# Patient Record
Sex: Female | Born: 1935 | Race: White | Hispanic: No | Marital: Married
Health system: Southern US, Community
[De-identification: ages and names within clinical notes are randomized; demographics above are authoritative.]

---

## 2002-12-02 ENCOUNTER — Other Ambulatory Visit: Payer: Self-pay

## 2004-05-19 ENCOUNTER — Ambulatory Visit: Payer: Self-pay | Admitting: Family Medicine

## 2004-11-29 ENCOUNTER — Ambulatory Visit: Payer: Self-pay | Admitting: Family Medicine

## 2004-12-05 ENCOUNTER — Ambulatory Visit: Payer: Self-pay | Admitting: *Deleted

## 2005-01-02 ENCOUNTER — Ambulatory Visit: Payer: Self-pay | Admitting: Oncology

## 2005-01-16 ENCOUNTER — Ambulatory Visit: Payer: Self-pay | Admitting: Oncology

## 2005-02-16 ENCOUNTER — Ambulatory Visit: Payer: Self-pay | Admitting: Oncology

## 2005-03-16 ENCOUNTER — Ambulatory Visit: Payer: Self-pay | Admitting: Oncology

## 2005-04-18 ENCOUNTER — Ambulatory Visit: Payer: Self-pay | Admitting: Oncology

## 2005-05-16 ENCOUNTER — Ambulatory Visit: Payer: Self-pay | Admitting: Oncology

## 2005-06-16 ENCOUNTER — Ambulatory Visit: Payer: Self-pay | Admitting: Oncology

## 2005-07-16 ENCOUNTER — Ambulatory Visit: Payer: Self-pay | Admitting: Oncology

## 2005-08-23 ENCOUNTER — Ambulatory Visit: Payer: Self-pay | Admitting: Oncology

## 2005-09-16 ENCOUNTER — Ambulatory Visit: Payer: Self-pay | Admitting: Oncology

## 2005-09-21 ENCOUNTER — Ambulatory Visit: Payer: Self-pay | Admitting: Family Medicine

## 2005-10-16 ENCOUNTER — Ambulatory Visit: Payer: Self-pay | Admitting: Oncology

## 2005-11-16 ENCOUNTER — Ambulatory Visit: Payer: Self-pay | Admitting: Oncology

## 2005-12-21 ENCOUNTER — Ambulatory Visit: Payer: Self-pay | Admitting: Oncology

## 2006-01-16 ENCOUNTER — Ambulatory Visit: Payer: Self-pay | Admitting: Oncology

## 2006-02-16 ENCOUNTER — Ambulatory Visit: Payer: Self-pay | Admitting: Oncology

## 2006-03-17 ENCOUNTER — Ambulatory Visit: Payer: Self-pay | Admitting: Oncology

## 2006-04-17 ENCOUNTER — Ambulatory Visit: Payer: Self-pay | Admitting: Oncology

## 2006-05-17 ENCOUNTER — Ambulatory Visit: Payer: Self-pay | Admitting: Oncology

## 2006-06-17 ENCOUNTER — Ambulatory Visit: Payer: Self-pay | Admitting: Oncology

## 2006-07-17 ENCOUNTER — Ambulatory Visit: Payer: Self-pay | Admitting: Oncology

## 2006-08-17 ENCOUNTER — Ambulatory Visit: Payer: Self-pay | Admitting: Oncology

## 2006-09-17 ENCOUNTER — Ambulatory Visit: Payer: Self-pay | Admitting: Oncology

## 2006-10-17 ENCOUNTER — Ambulatory Visit: Payer: Self-pay | Admitting: Oncology

## 2006-10-25 ENCOUNTER — Ambulatory Visit: Payer: Self-pay | Admitting: Oncology

## 2006-11-17 ENCOUNTER — Ambulatory Visit: Payer: Self-pay | Admitting: Oncology

## 2006-12-17 ENCOUNTER — Ambulatory Visit: Payer: Self-pay | Admitting: Oncology

## 2007-01-17 ENCOUNTER — Ambulatory Visit: Payer: Self-pay | Admitting: Oncology

## 2007-02-07 ENCOUNTER — Ambulatory Visit: Payer: Self-pay | Admitting: Oncology

## 2007-02-13 ENCOUNTER — Ambulatory Visit: Payer: Self-pay | Admitting: Oncology

## 2007-02-17 ENCOUNTER — Ambulatory Visit: Payer: Self-pay | Admitting: Oncology

## 2007-04-17 ENCOUNTER — Ambulatory Visit: Payer: Self-pay | Admitting: Oncology

## 2007-04-18 ENCOUNTER — Ambulatory Visit: Payer: Self-pay | Admitting: Oncology

## 2007-05-17 ENCOUNTER — Ambulatory Visit: Payer: Self-pay | Admitting: Oncology

## 2007-06-17 ENCOUNTER — Ambulatory Visit: Payer: Self-pay | Admitting: Oncology

## 2007-07-17 ENCOUNTER — Ambulatory Visit: Payer: Self-pay | Admitting: Oncology

## 2007-07-31 ENCOUNTER — Ambulatory Visit: Payer: Self-pay | Admitting: Ophthalmology

## 2007-08-17 ENCOUNTER — Ambulatory Visit: Payer: Self-pay | Admitting: Oncology

## 2007-08-22 ENCOUNTER — Ambulatory Visit: Payer: Self-pay | Admitting: Oncology

## 2007-09-17 ENCOUNTER — Ambulatory Visit: Payer: Self-pay | Admitting: Oncology

## 2007-10-03 ENCOUNTER — Ambulatory Visit: Payer: Self-pay | Admitting: Family Medicine

## 2008-02-17 ENCOUNTER — Ambulatory Visit: Payer: Self-pay | Admitting: Oncology

## 2008-02-20 ENCOUNTER — Ambulatory Visit: Payer: Self-pay | Admitting: Oncology

## 2008-03-16 ENCOUNTER — Ambulatory Visit: Payer: Self-pay | Admitting: Oncology

## 2008-06-16 ENCOUNTER — Ambulatory Visit: Payer: Self-pay | Admitting: Oncology

## 2008-06-24 ENCOUNTER — Ambulatory Visit: Payer: Self-pay | Admitting: Oncology

## 2008-07-16 ENCOUNTER — Ambulatory Visit: Payer: Self-pay | Admitting: Oncology

## 2008-08-16 ENCOUNTER — Ambulatory Visit: Payer: Self-pay | Admitting: Oncology

## 2008-08-20 IMAGING — CT CT ABD-PELV W/ CM
1 of 2 series · 15 of 32 positions shown, 19 images · non-contrast
Comparison: none

REASON FOR EXAM: Abd Pain Elevated CEA Diabetic Not Metformin
COMMENTS:

PROCEDURE:     CT  - CT ABDOMEN / PELVIS  W  - September 21, 2005  [DATE]
RESULT:
REASON FOR CONSULTATION:    Pelvic pain with elevated CEA.
TECHNIQUE: Axial images were obtained from the hemidiaphragm to the pubic
symphysis post intravenous and oral administration of contrast material.

[Series 2: soft tissue · axial · 0.85mm/px · z∈[-660,-252]mm · 15 of 57 slices shown, 19 images]
[im 3/57  soft-tissue]
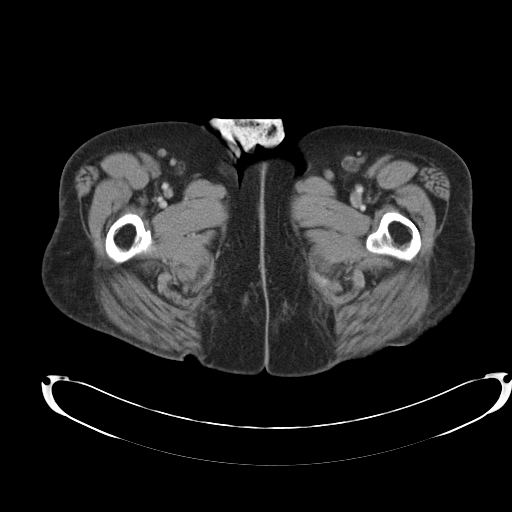
[im 3/57  bone]
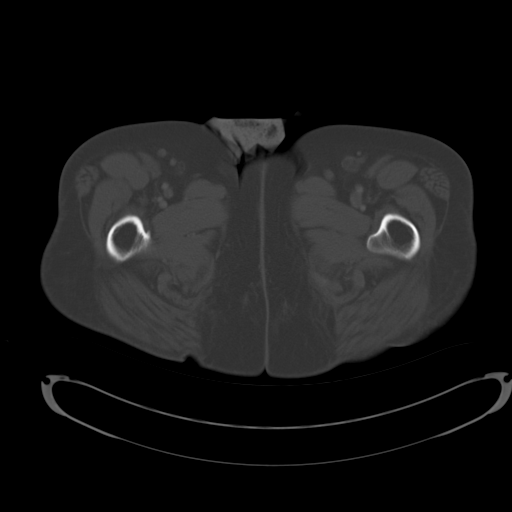
[im 8/57  soft-tissue]
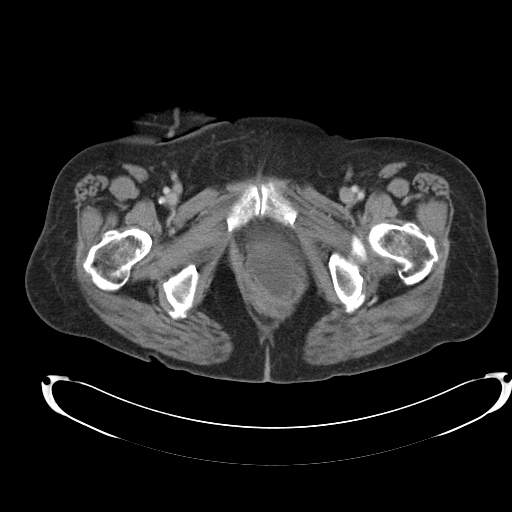
[im 13/57  soft-tissue]
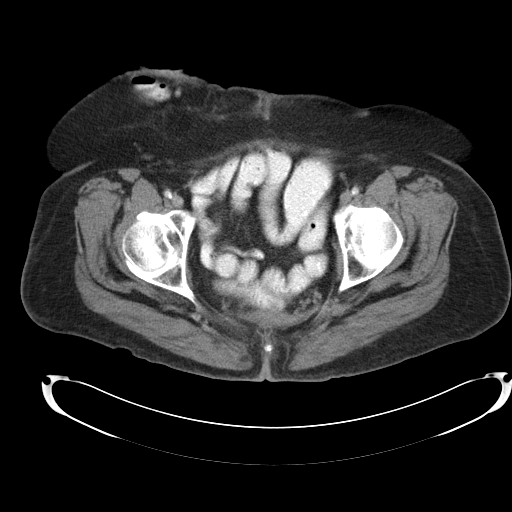
[im 15/57  soft-tissue]
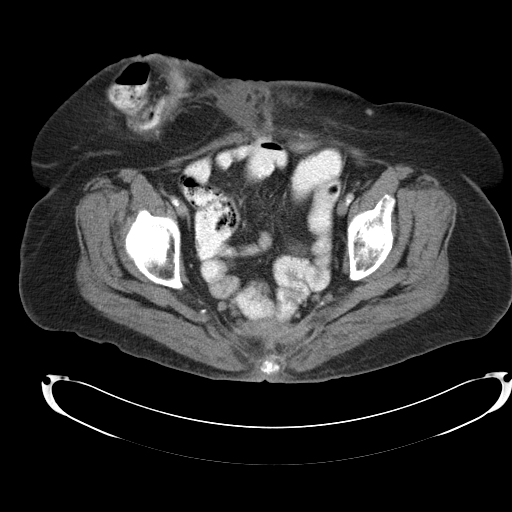
[im 20/57  soft-tissue]
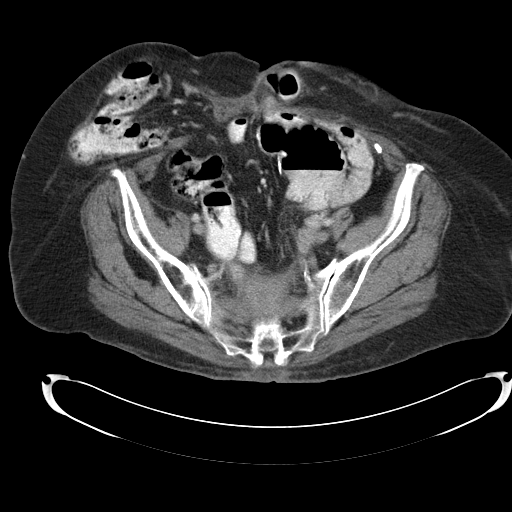
[im 25/57  soft-tissue]
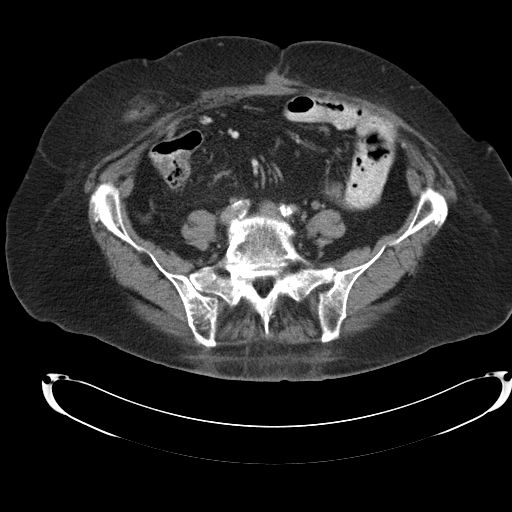
[im 30/57  soft-tissue]
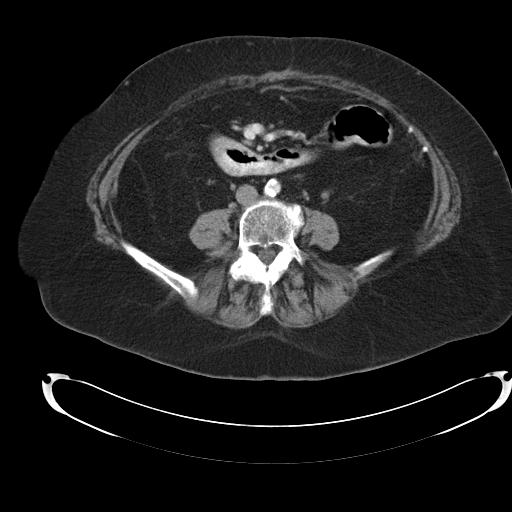
[im 32/57  soft-tissue]
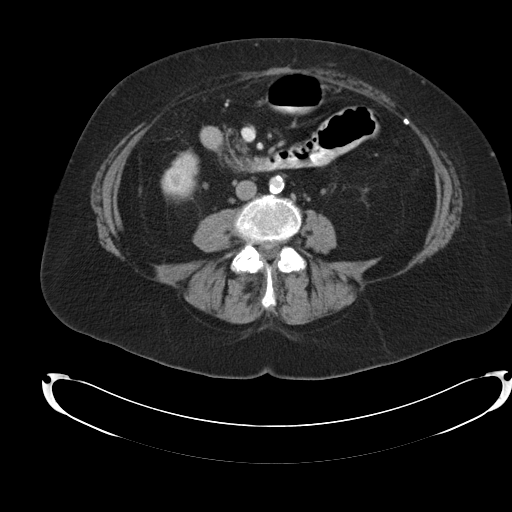
[im 37/57  soft-tissue]
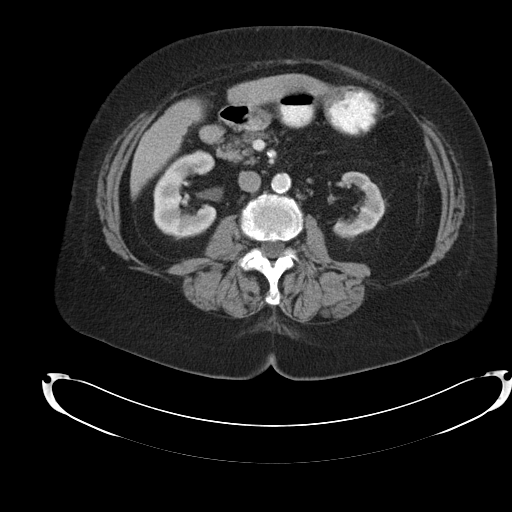
[im 37/57  bone]
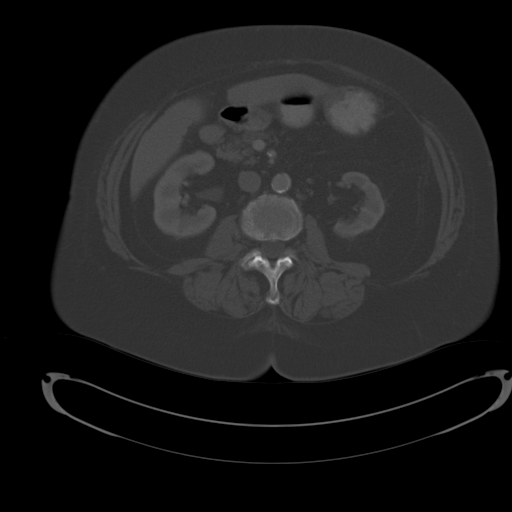
[im 42/57  soft-tissue]
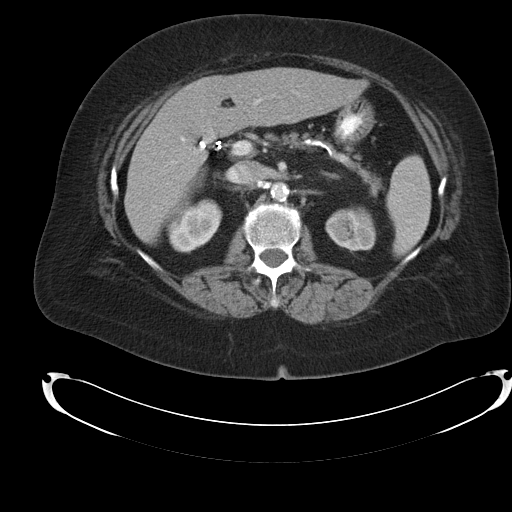
[im 44/57  soft-tissue]
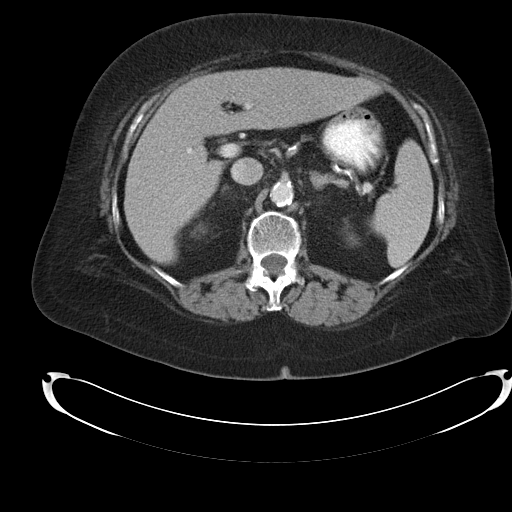
[im 47/57  lung]
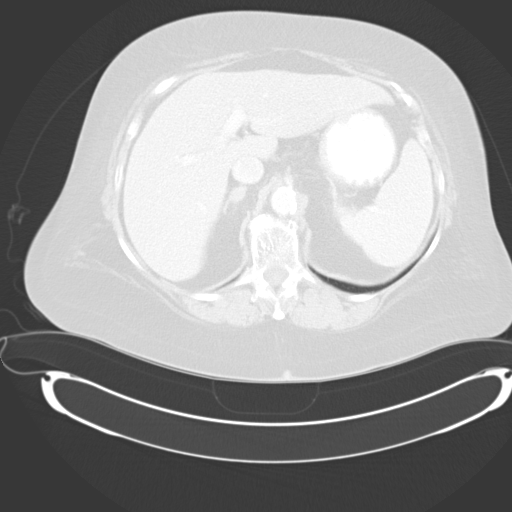
[im 49/57  soft-tissue]
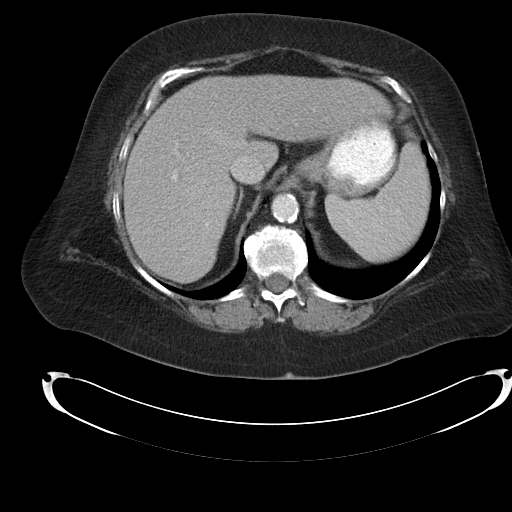
[im 49/57  lung]
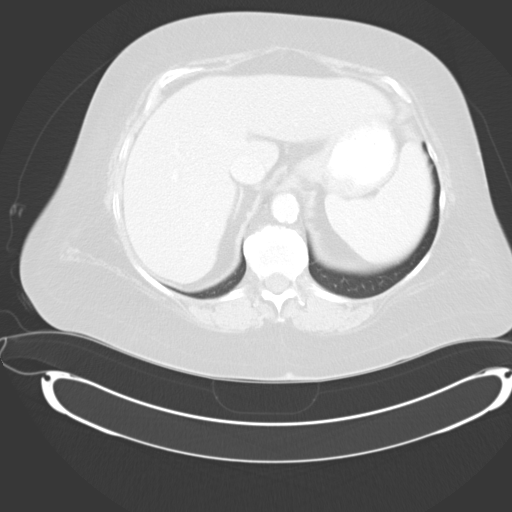
[im 52/57  lung]
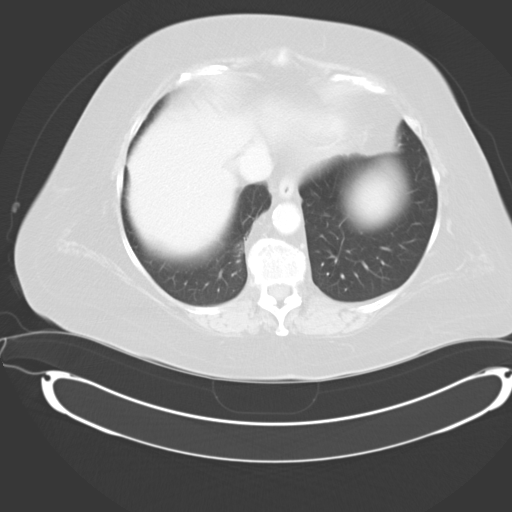
[im 54/57  soft-tissue]
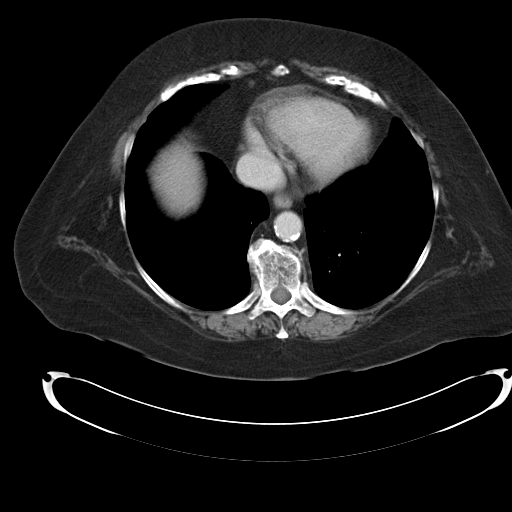
[im 54/57  lung]
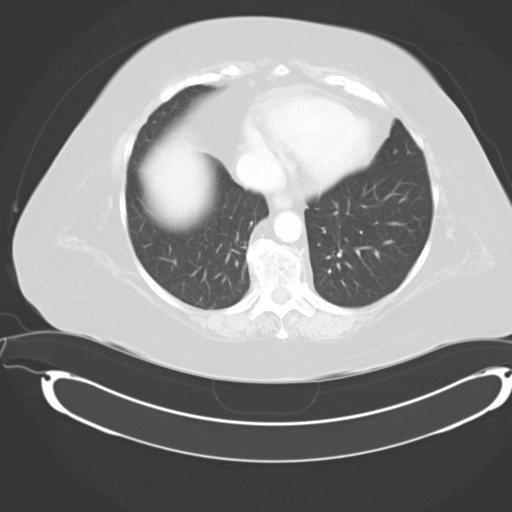

[15 of 32 positions shown; findings below may reference images not displayed]

FINDINGS: The liver and spleen appear intact.  No focal masses are seen.
 No masses are noted of the pancreas.  Both kidneys excrete the contrast
material.  No masses identified in either kidney.  No ascites is present.
There is noted anterior and anterolateral abdominal wall hernia in the
midpelvis.  The RIGHT lateral hernia contains colon which is not distended.
In the midline, periumbilical region is a loop of small bowel which does not
appear distended.  No pelvic masses, adenopathy or retroperitoneal
adenopathy is seen.

Images through the lung bases reveal the lung bases to be clear with no
effusions.
IMPRESSION: 1.     Anterior wall hernia in the RIGHT midpelvis containing colon and fat.
 No distention of colon is seen.
2.     Periumbilical hernia at the same level containing small bowel.  No
distention of bowel loops are seen.  No additional abnormalities are noted.

## 2008-09-16 ENCOUNTER — Ambulatory Visit: Payer: Self-pay | Admitting: Oncology

## 2008-10-05 ENCOUNTER — Ambulatory Visit: Payer: Self-pay | Admitting: Oncology

## 2008-10-14 ENCOUNTER — Ambulatory Visit: Payer: Self-pay | Admitting: Oncology

## 2008-10-16 ENCOUNTER — Ambulatory Visit: Payer: Self-pay | Admitting: Oncology

## 2008-11-16 ENCOUNTER — Ambulatory Visit: Payer: Self-pay | Admitting: Oncology

## 2008-12-16 ENCOUNTER — Ambulatory Visit: Payer: Self-pay | Admitting: Oncology

## 2009-01-16 ENCOUNTER — Ambulatory Visit: Payer: Self-pay | Admitting: Oncology

## 2009-02-16 ENCOUNTER — Ambulatory Visit: Payer: Self-pay | Admitting: Oncology

## 2009-02-22 ENCOUNTER — Ambulatory Visit: Payer: Self-pay | Admitting: Oncology

## 2009-03-09 ENCOUNTER — Ambulatory Visit: Payer: Self-pay | Admitting: Oncology

## 2009-03-15 ENCOUNTER — Ambulatory Visit: Payer: Self-pay | Admitting: Vascular Surgery

## 2009-03-16 ENCOUNTER — Ambulatory Visit: Payer: Self-pay | Admitting: Oncology

## 2009-04-16 ENCOUNTER — Ambulatory Visit: Payer: Self-pay | Admitting: Oncology

## 2009-05-13 ENCOUNTER — Ambulatory Visit: Payer: Self-pay | Admitting: Oncology

## 2009-05-16 ENCOUNTER — Ambulatory Visit: Payer: Self-pay | Admitting: Oncology

## 2009-06-16 ENCOUNTER — Ambulatory Visit: Payer: Self-pay | Admitting: Oncology

## 2009-06-28 ENCOUNTER — Ambulatory Visit: Payer: Self-pay | Admitting: General Surgery

## 2009-07-16 ENCOUNTER — Ambulatory Visit: Payer: Self-pay | Admitting: Oncology

## 2009-08-16 ENCOUNTER — Ambulatory Visit: Payer: Self-pay | Admitting: Oncology

## 2009-09-15 ENCOUNTER — Ambulatory Visit: Payer: Self-pay | Admitting: Oncology

## 2009-09-16 ENCOUNTER — Ambulatory Visit: Payer: Self-pay | Admitting: Oncology

## 2009-10-16 ENCOUNTER — Ambulatory Visit: Payer: Self-pay | Admitting: Oncology

## 2009-11-16 ENCOUNTER — Ambulatory Visit: Payer: Self-pay | Admitting: Oncology

## 2009-11-18 ENCOUNTER — Ambulatory Visit: Payer: Self-pay | Admitting: Oncology

## 2009-12-16 ENCOUNTER — Ambulatory Visit: Payer: Self-pay | Admitting: Oncology

## 2010-01-16 ENCOUNTER — Ambulatory Visit: Payer: Self-pay | Admitting: Oncology

## 2010-02-16 ENCOUNTER — Ambulatory Visit: Payer: Self-pay | Admitting: Oncology

## 2010-03-17 ENCOUNTER — Ambulatory Visit: Payer: Self-pay | Admitting: Oncology

## 2010-03-18 ENCOUNTER — Emergency Department: Payer: Self-pay | Admitting: Emergency Medicine

## 2010-04-17 ENCOUNTER — Ambulatory Visit: Payer: Self-pay | Admitting: Oncology

## 2010-05-17 ENCOUNTER — Ambulatory Visit: Payer: Self-pay | Admitting: Oncology

## 2010-06-17 ENCOUNTER — Ambulatory Visit: Payer: Self-pay | Admitting: Oncology

## 2010-06-28 ENCOUNTER — Ambulatory Visit: Payer: Self-pay | Admitting: Family Medicine

## 2010-07-17 ENCOUNTER — Ambulatory Visit: Payer: Self-pay | Admitting: Oncology

## 2010-08-03 ENCOUNTER — Ambulatory Visit: Payer: Self-pay | Admitting: Family Medicine

## 2010-08-04 ENCOUNTER — Ambulatory Visit: Payer: Self-pay | Admitting: Oncology

## 2010-08-04 ENCOUNTER — Ambulatory Visit: Payer: Self-pay

## 2010-08-17 ENCOUNTER — Ambulatory Visit: Payer: Self-pay | Admitting: Oncology

## 2010-08-18 ENCOUNTER — Inpatient Hospital Stay: Payer: Self-pay | Admitting: Orthopedic Surgery

## 2010-09-06 LAB — CEA: CEA: 9.4 ng/mL — ABNORMAL HIGH (ref 0.0–4.7)

## 2010-09-17 ENCOUNTER — Ambulatory Visit: Payer: Self-pay | Admitting: Oncology

## 2010-09-23 ENCOUNTER — Ambulatory Visit: Payer: Self-pay | Admitting: Oncology

## 2010-10-17 ENCOUNTER — Ambulatory Visit: Payer: Self-pay | Admitting: Oncology

## 2010-11-17 ENCOUNTER — Ambulatory Visit: Payer: Self-pay | Admitting: Oncology

## 2010-11-26 LAB — CEA: CEA: 6.8 ng/mL — ABNORMAL HIGH (ref 0.0–4.7)

## 2010-12-17 ENCOUNTER — Ambulatory Visit: Payer: Self-pay | Admitting: Oncology

## 2011-01-27 ENCOUNTER — Ambulatory Visit: Payer: Self-pay | Admitting: Oncology

## 2011-02-17 ENCOUNTER — Ambulatory Visit: Payer: Self-pay | Admitting: Oncology

## 2011-03-07 LAB — COMPREHENSIVE METABOLIC PANEL
Albumin: 3.9 g/dL (ref 3.4–5.0)
Anion Gap: 10 (ref 7–16)
Bilirubin,Total: 0.5 mg/dL (ref 0.2–1.0)
Calcium, Total: 9.2 mg/dL (ref 8.5–10.1)
Co2: 25 mmol/L (ref 21–32)
Creatinine: 1.12 mg/dL (ref 0.60–1.30)
EGFR (African American): >60
EGFR (Non-African Amer.): 51 — ABNORMAL LOW
Osmolality: 281 (ref 275–301)
SGOT(AST): 32 U/L (ref 15–37)
Total Protein: 7.5 g/dL (ref 6.4–8.2)

## 2011-03-07 LAB — CBC CANCER CENTER
Basophil %: 0.4 %
Eosinophil #: 0.1 x10 3/mm (ref 0.0–0.7)
Eosinophil %: 2 %
HCT: 39.3 % (ref 35.0–47.0)
HGB: 13.4 g/dL (ref 12.0–16.0)
Lymphocyte %: 15 %
MCHC: 34.2 g/dL (ref 32.0–36.0)
Monocyte %: 6.6 %
Neutrophil #: 4.7 x10 3/mm (ref 1.4–6.5)
Neutrophil %: 76 %
WBC: 6.2 x10 3/mm (ref 3.6–11.0)

## 2011-03-08 LAB — CEA: CEA: 6.2 ng/mL — ABNORMAL HIGH (ref 0.0–4.7)

## 2011-03-17 ENCOUNTER — Ambulatory Visit: Payer: Self-pay | Admitting: Oncology

## 2011-04-25 ENCOUNTER — Ambulatory Visit: Payer: Self-pay | Admitting: Oncology

## 2011-05-17 ENCOUNTER — Ambulatory Visit: Payer: Self-pay | Admitting: Oncology

## 2011-06-17 ENCOUNTER — Ambulatory Visit: Payer: Self-pay | Admitting: Oncology

## 2011-07-06 LAB — COMPREHENSIVE METABOLIC PANEL
Albumin: 3.5 g/dL (ref 3.4–5.0)
Alkaline Phosphatase: 108 U/L (ref 50–136)
Anion Gap: 12 (ref 7–16)
BUN: 19 mg/dL — ABNORMAL HIGH (ref 7–18)
Bilirubin,Total: 0.4 mg/dL (ref 0.2–1.0)
Chloride: 105 mmol/L (ref 98–107)
Co2: 23 mmol/L (ref 21–32)
EGFR (Non-African Amer.): 49 — ABNORMAL LOW
Glucose: 171 mg/dL — ABNORMAL HIGH (ref 65–99)
Osmolality: 286 (ref 275–301)
Potassium: 4.7 mmol/L (ref 3.5–5.1)
SGOT(AST): 26 U/L (ref 15–37)
SGPT (ALT): 28 U/L
Sodium: 140 mmol/L (ref 136–145)

## 2011-07-06 LAB — CBC CANCER CENTER
Basophil %: 1.1 %
Eosinophil #: 0.2 x10 3/mm (ref 0.0–0.7)
Eosinophil %: 4.8 %
MCH: 27.4 pg (ref 26.0–34.0)
Monocyte %: 8.8 %
Platelet: 134 x10 3/mm — ABNORMAL LOW (ref 150–440)

## 2011-07-17 ENCOUNTER — Ambulatory Visit: Payer: Self-pay | Admitting: Oncology

## 2011-08-22 ENCOUNTER — Ambulatory Visit: Payer: Self-pay | Admitting: Oncology

## 2011-09-17 ENCOUNTER — Ambulatory Visit: Payer: Self-pay | Admitting: Oncology

## 2011-10-04 LAB — CBC CANCER CENTER
Basophil #: 0.1 x10 3/mm (ref 0.0–0.1)
Basophil %: 1.4 %
Eosinophil #: 0.2 x10 3/mm (ref 0.0–0.7)
Eosinophil %: 3.4 %
HCT: 30.7 % — ABNORMAL LOW (ref 35.0–47.0)
HGB: 9.1 g/dL — ABNORMAL LOW (ref 12.0–16.0)
Lymphocyte #: 0.9 x10 3/mm — ABNORMAL LOW (ref 1.0–3.6)
Lymphocyte %: 16.6 %
MCH: 20 pg — ABNORMAL LOW (ref 26.0–34.0)
MCHC: 29.6 g/dL — ABNORMAL LOW (ref 32.0–36.0)
MCV: 68 fL — ABNORMAL LOW (ref 80–100)
Monocyte #: 0.5 x10 3/mm (ref 0.2–0.9)
Monocyte %: 9.6 %
Neutrophil #: 3.9 x10 3/mm (ref 1.4–6.5)
Neutrophil %: 69 %
Platelet: 140 x10 3/mm — ABNORMAL LOW (ref 150–440)
RBC: 4.53 10*6/uL (ref 3.80–5.20)
RDW: 19.8 % — ABNORMAL HIGH (ref 11.5–14.5)
WBC: 5.6 x10 3/mm (ref 3.6–11.0)

## 2011-10-04 LAB — IRON AND TIBC
Iron Bind.Cap.(Total): 550 ug/dL — ABNORMAL HIGH (ref 250–450)
Iron Saturation: 5 %
Iron: 30 ug/dL — ABNORMAL LOW (ref 50–170)
Unbound Iron-Bind.Cap.: 520 ug/dL

## 2011-10-04 LAB — FERRITIN: Ferritin (ARMC): 6 ng/mL — ABNORMAL LOW (ref 8–388)

## 2011-10-17 ENCOUNTER — Ambulatory Visit: Payer: Self-pay | Admitting: Oncology

## 2011-11-15 LAB — COMPREHENSIVE METABOLIC PANEL
Albumin: 3.5 g/dL (ref 3.4–5.0)
Bilirubin,Total: 0.4 mg/dL (ref 0.2–1.0)
Calcium, Total: 8.8 mg/dL (ref 8.5–10.1)
Chloride: 104 mmol/L (ref 98–107)
Co2: 19 mmol/L — ABNORMAL LOW (ref 21–32)
Creatinine: 1.34 mg/dL — ABNORMAL HIGH (ref 0.60–1.30)
EGFR (African American): 45 — ABNORMAL LOW
EGFR (Non-African Amer.): 39 — ABNORMAL LOW
Potassium: 4.1 mmol/L (ref 3.5–5.1)
SGOT(AST): 22 U/L (ref 15–37)
SGPT (ALT): 26 U/L (ref 12–78)

## 2011-11-15 LAB — CBC CANCER CENTER
Basophil #: 0.1 x10 3/mm (ref 0.0–0.1)
Basophil %: 1.2 %
Eosinophil #: 0.1 x10 3/mm (ref 0.0–0.7)
HCT: 29 % — ABNORMAL LOW (ref 35.0–47.0)
Lymphocyte #: 1.5 x10 3/mm (ref 1.0–3.6)
MCHC: 30.1 g/dL — ABNORMAL LOW (ref 32.0–36.0)
MCV: 69 fL — ABNORMAL LOW (ref 80–100)
Monocyte %: 9.9 %
Neutrophil #: 3.9 x10 3/mm (ref 1.4–6.5)
Platelet: 127 x10 3/mm — ABNORMAL LOW (ref 150–440)
RDW: 24.9 % — ABNORMAL HIGH (ref 11.5–14.5)

## 2011-11-15 LAB — IRON AND TIBC: Iron Bind.Cap.(Total): 530 ug/dL — ABNORMAL HIGH (ref 250–450)

## 2011-11-17 ENCOUNTER — Ambulatory Visit: Payer: Self-pay | Admitting: Oncology

## 2011-11-23 ENCOUNTER — Inpatient Hospital Stay: Payer: Self-pay | Admitting: Surgery

## 2011-11-23 LAB — COMPREHENSIVE METABOLIC PANEL
Anion Gap: 14 (ref 7–16)
Bilirubin,Total: 0.5 mg/dL (ref 0.2–1.0)
Calcium, Total: 9.1 mg/dL (ref 8.5–10.1)
Chloride: 99 mmol/L (ref 98–107)
Co2: 22 mmol/L (ref 21–32)
Creatinine: 1.14 mg/dL (ref 0.60–1.30)
EGFR (African American): 55 — ABNORMAL LOW
EGFR (Non-African Amer.): 48 — ABNORMAL LOW
Glucose: 132 mg/dL — ABNORMAL HIGH (ref 65–99)
Osmolality: 271 (ref 275–301)
Sodium: 135 mmol/L — ABNORMAL LOW (ref 136–145)
Total Protein: 7.2 g/dL (ref 6.4–8.2)

## 2011-11-23 LAB — CBC CANCER CENTER
Basophil #: 0.1 x10 3/mm (ref 0.0–0.1)
Eosinophil #: 0.1 x10 3/mm (ref 0.0–0.7)
Eosinophil %: 1.5 %
HCT: 33 % — ABNORMAL LOW (ref 35.0–47.0)
HGB: 9.9 g/dL — ABNORMAL LOW (ref 12.0–16.0)
Lymphocyte %: 10.8 %
MCV: 71 fL — ABNORMAL LOW (ref 80–100)
Neutrophil #: 6.2 x10 3/mm (ref 1.4–6.5)
Neutrophil %: 79.7 %
RBC: 4.64 10*6/uL (ref 3.80–5.20)

## 2011-11-23 LAB — URINALYSIS, COMPLETE
Bacteria: NONE SEEN
Bilirubin,UR: NEGATIVE
Glucose,UR: NEGATIVE mg/dL (ref 0–75)
Ketone: NEGATIVE
Protein: 100
RBC,UR: 3 /HPF (ref 0–5)
Specific Gravity: 1.011 (ref 1.003–1.030)
Squamous Epithelial: 1
WBC UR: 28 /HPF (ref 0–5)

## 2011-11-23 LAB — AMYLASE: Amylase: 96 U/L (ref 25–115)

## 2011-11-23 LAB — LIPASE, BLOOD: Lipase: 232 U/L (ref 73–393)

## 2011-11-24 LAB — BASIC METABOLIC PANEL
Anion Gap: 12 (ref 7–16)
BUN: 11 mg/dL (ref 7–18)
Calcium, Total: 8.6 mg/dL (ref 8.5–10.1)
Chloride: 103 mmol/L (ref 98–107)
Co2: 23 mmol/L (ref 21–32)
Creatinine: 1.05 mg/dL (ref 0.60–1.30)
EGFR (African American): >60
Osmolality: 277 (ref 275–301)
Sodium: 138 mmol/L (ref 136–145)

## 2011-11-24 LAB — CBC WITH DIFFERENTIAL/PLATELET
Basophil #: 0.1 10*3/uL (ref 0.0–0.1)
Basophil %: 1 %
Eosinophil #: 0.1 10*3/uL (ref 0.0–0.7)
Eosinophil %: 0.8 %
HCT: 29.5 % — ABNORMAL LOW (ref 35.0–47.0)
HGB: 9.3 g/dL — ABNORMAL LOW (ref 12.0–16.0)
Lymphocyte %: 12.5 %
MCHC: 31.4 g/dL — ABNORMAL LOW (ref 32.0–36.0)
Monocyte #: 0.7 x10 3/mm (ref 0.2–0.9)
Monocyte %: 9.6 %
Neutrophil #: 5.2 10*3/uL (ref 1.4–6.5)
Neutrophil %: 76.1 %
RBC: 4.23 10*6/uL (ref 3.80–5.20)
WBC: 6.8 10*3/uL (ref 3.6–11.0)

## 2011-11-24 LAB — IRON AND TIBC
Iron Bind.Cap.(Total): 460 ug/dL — ABNORMAL HIGH (ref 250–450)
Iron Saturation: 5 %
Unbound Iron-Bind.Cap.: 437 ug/dL

## 2011-11-25 LAB — CBC WITH DIFFERENTIAL/PLATELET
Basophil #: 0 10*3/uL (ref 0.0–0.1)
Eosinophil %: 0 %
HCT: 29.8 % — ABNORMAL LOW (ref 35.0–47.0)
Lymphocyte #: 0.2 10*3/uL — ABNORMAL LOW (ref 1.0–3.6)
Lymphocyte %: 1.6 %
MCV: 71 fL — ABNORMAL LOW (ref 80–100)
Monocyte #: 0.7 x10 3/mm (ref 0.2–0.9)
Monocyte %: 5.3 %
Neutrophil %: 92.9 %
Platelet: 106 10*3/uL — ABNORMAL LOW (ref 150–440)
RBC: 4.23 10*6/uL (ref 3.80–5.20)
RDW: 27 % — ABNORMAL HIGH (ref 11.5–14.5)
WBC: 13.6 10*3/uL — ABNORMAL HIGH (ref 3.6–11.0)

## 2011-11-25 LAB — BASIC METABOLIC PANEL
BUN: 14 mg/dL (ref 7–18)
Calcium, Total: 7.8 mg/dL — ABNORMAL LOW (ref 8.5–10.1)
Chloride: 106 mmol/L (ref 98–107)
Co2: 21 mmol/L (ref 21–32)
Creatinine: 1.34 mg/dL — ABNORMAL HIGH (ref 0.60–1.30)
Glucose: 142 mg/dL — ABNORMAL HIGH (ref 65–99)
Potassium: 5 mmol/L (ref 3.5–5.1)

## 2011-11-26 LAB — CBC WITH DIFFERENTIAL/PLATELET
Basophil #: 0 10*3/uL (ref 0.0–0.1)
Basophil %: 0.2 %
Eosinophil #: 0 10*3/uL (ref 0.0–0.7)
Eosinophil %: 0 %
HGB: 8 g/dL — ABNORMAL LOW (ref 12.0–16.0)
Lymphocyte #: 0.3 10*3/uL — ABNORMAL LOW (ref 1.0–3.6)
Lymphocyte %: 2.8 %
MCH: 21.9 pg — ABNORMAL LOW (ref 26.0–34.0)
MCHC: 30.9 g/dL — ABNORMAL LOW (ref 32.0–36.0)
MCV: 71 fL — ABNORMAL LOW (ref 80–100)
Monocyte #: 1 x10 3/mm — ABNORMAL HIGH (ref 0.2–0.9)
Neutrophil %: 88.2 %
Platelet: 67 10*3/uL — ABNORMAL LOW (ref 150–440)

## 2011-11-26 LAB — BASIC METABOLIC PANEL
BUN: 20 mg/dL — ABNORMAL HIGH (ref 7–18)
Calcium, Total: 7.5 mg/dL — ABNORMAL LOW (ref 8.5–10.1)
Chloride: 111 mmol/L — ABNORMAL HIGH (ref 98–107)
Co2: 18 mmol/L — ABNORMAL LOW (ref 21–32)
Creatinine: 1.23 mg/dL (ref 0.60–1.30)
EGFR (African American): 50 — ABNORMAL LOW
Potassium: 5.3 mmol/L — ABNORMAL HIGH (ref 3.5–5.1)
Sodium: 139 mmol/L (ref 136–145)

## 2011-11-26 LAB — IRON AND TIBC
Iron Bind.Cap.(Total): 321 ug/dL (ref 250–450)
Iron Saturation: 4 %
Iron: 12 ug/dL — ABNORMAL LOW (ref 50–170)

## 2011-11-26 LAB — FERRITIN: Ferritin (ARMC): 53 ng/mL (ref 8–388)

## 2011-11-27 LAB — BASIC METABOLIC PANEL
Anion Gap: 8 (ref 7–16)
BUN: 22 mg/dL — ABNORMAL HIGH (ref 7–18)
Chloride: 113 mmol/L — ABNORMAL HIGH (ref 98–107)
Co2: 19 mmol/L — ABNORMAL LOW (ref 21–32)
Glucose: 140 mg/dL — ABNORMAL HIGH (ref 65–99)
Osmolality: 285 (ref 275–301)
Potassium: 5.1 mmol/L (ref 3.5–5.1)
Sodium: 140 mmol/L (ref 136–145)

## 2011-11-27 LAB — CBC WITH DIFFERENTIAL/PLATELET
Basophil #: 0 10*3/uL (ref 0.0–0.1)
Basophil %: 0.2 %
Eosinophil #: 0 10*3/uL (ref 0.0–0.7)
Eosinophil %: 0 %
HCT: 24.6 % — ABNORMAL LOW (ref 35.0–47.0)
HGB: 7.9 g/dL — ABNORMAL LOW (ref 12.0–16.0)
Lymphocyte %: 3.1 %
MCH: 22.8 pg — ABNORMAL LOW (ref 26.0–34.0)
Monocyte #: 0.8 x10 3/mm (ref 0.2–0.9)
Monocyte %: 7.9 %
Neutrophil #: 9.1 10*3/uL — ABNORMAL HIGH (ref 1.4–6.5)
Neutrophil %: 88.8 %
WBC: 10.2 10*3/uL (ref 3.6–11.0)

## 2011-11-28 LAB — COMPREHENSIVE METABOLIC PANEL
Alkaline Phosphatase: 62 U/L (ref 50–136)
BUN: 24 mg/dL — ABNORMAL HIGH (ref 7–18)
Bilirubin,Total: 0.7 mg/dL (ref 0.2–1.0)
Chloride: 112 mmol/L — ABNORMAL HIGH (ref 98–107)
Creatinine: 1.07 mg/dL (ref 0.60–1.30)
EGFR (African American): 60 — ABNORMAL LOW
EGFR (Non-African Amer.): 51 — ABNORMAL LOW
SGOT(AST): 42 U/L — ABNORMAL HIGH (ref 15–37)
SGPT (ALT): 18 U/L (ref 12–78)
Total Protein: 5.7 g/dL — ABNORMAL LOW (ref 6.4–8.2)

## 2011-11-28 LAB — CBC WITH DIFFERENTIAL/PLATELET
Basophil %: 0.1 %
Eosinophil #: 0 10*3/uL (ref 0.0–0.7)
Eosinophil %: 0 %
HCT: 24.6 % — ABNORMAL LOW (ref 35.0–47.0)
HGB: 7.7 g/dL — ABNORMAL LOW (ref 12.0–16.0)
Lymphocyte %: 4.9 %
MCH: 22.2 pg — ABNORMAL LOW (ref 26.0–34.0)
MCV: 72 fL — ABNORMAL LOW (ref 80–100)
Monocyte %: 9.3 %
Neutrophil #: 8.3 10*3/uL — ABNORMAL HIGH (ref 1.4–6.5)
Neutrophil %: 85.7 %
Platelet: 72 10*3/uL — ABNORMAL LOW (ref 150–440)
RBC: 3.44 10*6/uL — ABNORMAL LOW (ref 3.80–5.20)
WBC: 9.7 10*3/uL (ref 3.6–11.0)

## 2011-11-29 LAB — BASIC METABOLIC PANEL
Calcium, Total: 8 mg/dL — ABNORMAL LOW (ref 8.5–10.1)
Creatinine: 1.15 mg/dL (ref 0.60–1.30)
EGFR (African American): 55 — ABNORMAL LOW
EGFR (Non-African Amer.): 47 — ABNORMAL LOW
Glucose: 140 mg/dL — ABNORMAL HIGH (ref 65–99)
Osmolality: 293 (ref 275–301)
Potassium: 4.4 mmol/L (ref 3.5–5.1)
Sodium: 143 mmol/L (ref 136–145)

## 2011-11-29 LAB — CBC WITH DIFFERENTIAL/PLATELET
MCH: 22.4 pg — ABNORMAL LOW (ref 26.0–34.0)
MCV: 73 fL — ABNORMAL LOW (ref 80–100)
NRBC/100 WBC: 5 /
Platelet: 72 10*3/uL — ABNORMAL LOW (ref 150–440)
RBC: 3.38 10*6/uL — ABNORMAL LOW (ref 3.80–5.20)
Segmented Neutrophils: 84 %

## 2011-11-30 LAB — BASIC METABOLIC PANEL
BUN: 25 mg/dL — ABNORMAL HIGH (ref 7–18)
Calcium, Total: 7.9 mg/dL — ABNORMAL LOW (ref 8.5–10.1)
Chloride: 115 mmol/L — ABNORMAL HIGH (ref 98–107)
Co2: 18 mmol/L — ABNORMAL LOW (ref 21–32)
Creatinine: 1.06 mg/dL (ref 0.60–1.30)
EGFR (African American): >60
EGFR (Non-African Amer.): 52 — ABNORMAL LOW
Osmolality: 291 (ref 275–301)

## 2011-11-30 LAB — CBC WITH DIFFERENTIAL/PLATELET
HCT: 25.9 % — ABNORMAL LOW (ref 35.0–47.0)
Lymphocytes: 10 %
MCH: 22.8 pg — ABNORMAL LOW (ref 26.0–34.0)
MCV: 74 fL — ABNORMAL LOW (ref 80–100)
Myelocyte: 1 %
Platelet: 55 10*3/uL — ABNORMAL LOW (ref 150–440)
RBC: 3.49 10*6/uL — ABNORMAL LOW (ref 3.80–5.20)
RDW: 27 % — ABNORMAL HIGH (ref 11.5–14.5)
WBC: 7.4 10*3/uL (ref 3.6–11.0)

## 2011-12-01 LAB — CBC WITH DIFFERENTIAL/PLATELET
Basophil #: 0 10*3/uL (ref 0.0–0.1)
Basophil %: 0.3 %
Eosinophil: 1 %
HCT: 29.2 % — ABNORMAL LOW (ref 35.0–47.0)
HGB: 8.8 g/dL — ABNORMAL LOW (ref 12.0–16.0)
Lymphocyte %: 5.7 %
Lymphocytes: 10 %
MCH: 22.9 pg — ABNORMAL LOW (ref 26.0–34.0)
Monocyte %: 7.8 %
Monocytes: 11 %
NRBC/100 WBC: 6 /
Neutrophil #: 9 10*3/uL — ABNORMAL HIGH (ref 1.4–6.5)
Neutrophil %: 86.2 %
Platelet: 53 10*3/uL — ABNORMAL LOW (ref 150–440)
RBC: 3.87 10*6/uL (ref 3.80–5.20)
RDW: 27.3 % — ABNORMAL HIGH (ref 11.5–14.5)
Segmented Neutrophils: 59 %
WBC: 10.4 10*3/uL (ref 3.6–11.0)

## 2011-12-02 LAB — CBC WITH DIFFERENTIAL/PLATELET
Eosinophil: 1 %
HCT: 27.7 % — ABNORMAL LOW (ref 35.0–47.0)
HGB: 8.1 g/dL — ABNORMAL LOW (ref 12.0–16.0)
MCH: 22.4 pg — ABNORMAL LOW (ref 26.0–34.0)
MCV: 76 fL — ABNORMAL LOW (ref 80–100)
Monocytes: 6 %
NRBC/100 WBC: 4 /
RBC: 3.64 10*6/uL — ABNORMAL LOW (ref 3.80–5.20)
Segmented Neutrophils: 82 %

## 2011-12-02 LAB — FIBRINOGEN: Fibrinogen: 452 mg/dL (ref 210–470)

## 2011-12-02 LAB — PROTIME-INR: INR: 1.2

## 2011-12-03 LAB — BASIC METABOLIC PANEL
Anion Gap: 12 (ref 7–16)
BUN: 18 mg/dL (ref 7–18)
Creatinine: 0.8 mg/dL (ref 0.60–1.30)
EGFR (Non-African Amer.): >60
Glucose: 129 mg/dL — ABNORMAL HIGH (ref 65–99)
Osmolality: 285 (ref 275–301)
Potassium: 3.8 mmol/L (ref 3.5–5.1)

## 2011-12-03 LAB — CBC WITH DIFFERENTIAL/PLATELET
Basophil #: 0 10*3/uL (ref 0.0–0.1)
Basophil %: 0.1 %
Eosinophil %: 0.3 %
HGB: 8.5 g/dL — ABNORMAL LOW (ref 12.0–16.0)
Lymphocyte #: 0.4 10*3/uL — ABNORMAL LOW (ref 1.0–3.6)
Lymphocyte %: 5.1 %
MCV: 77 fL — ABNORMAL LOW (ref 80–100)
Monocyte #: 0.6 x10 3/mm (ref 0.2–0.9)
Monocyte %: 7.7 %
Neutrophil #: 6.9 10*3/uL — ABNORMAL HIGH (ref 1.4–6.5)
Neutrophil %: 86.8 %
Platelet: 42 10*3/uL — ABNORMAL LOW (ref 150–440)
RBC: 3.52 10*6/uL — ABNORMAL LOW (ref 3.80–5.20)
WBC: 7.9 10*3/uL (ref 3.6–11.0)

## 2011-12-04 LAB — BASIC METABOLIC PANEL
Anion Gap: 8 (ref 7–16)
BUN: 12 mg/dL (ref 7–18)
Calcium, Total: 7.4 mg/dL — ABNORMAL LOW (ref 8.5–10.1)
Creatinine: 0.87 mg/dL (ref 0.60–1.30)
EGFR (African American): >60
EGFR (Non-African Amer.): >60
Glucose: 123 mg/dL — ABNORMAL HIGH (ref 65–99)
Osmolality: 279 (ref 275–301)
Potassium: 3.8 mmol/L (ref 3.5–5.1)

## 2011-12-05 LAB — CBC WITH DIFFERENTIAL/PLATELET
Basophil #: 0 10*3/uL (ref 0.0–0.1)
Eosinophil #: 0 10*3/uL (ref 0.0–0.7)
HCT: 27.2 % — ABNORMAL LOW (ref 35.0–47.0)
HGB: 8.6 g/dL — ABNORMAL LOW (ref 12.0–16.0)
Lymphocyte #: 0.4 10*3/uL — ABNORMAL LOW (ref 1.0–3.6)
MCH: 24.4 pg — ABNORMAL LOW (ref 26.0–34.0)
MCHC: 31.6 g/dL — ABNORMAL LOW (ref 32.0–36.0)
MCV: 77 fL — ABNORMAL LOW (ref 80–100)
Monocyte #: 0.7 x10 3/mm (ref 0.2–0.9)
Neutrophil %: 83.4 %
Platelet: 42 10*3/uL — ABNORMAL LOW (ref 150–440)
RBC: 3.51 10*6/uL — ABNORMAL LOW (ref 3.80–5.20)
RDW: 35.8 % — ABNORMAL HIGH (ref 11.5–14.5)

## 2011-12-05 LAB — BASIC METABOLIC PANEL
Anion Gap: 7 (ref 7–16)
BUN: 11 mg/dL (ref 7–18)
Calcium, Total: 7.6 mg/dL — ABNORMAL LOW (ref 8.5–10.1)
Chloride: 110 mmol/L — ABNORMAL HIGH (ref 98–107)
EGFR (Non-African Amer.): >60
Glucose: 120 mg/dL — ABNORMAL HIGH (ref 65–99)
Osmolality: 274 (ref 275–301)
Potassium: 4.1 mmol/L (ref 3.5–5.1)

## 2011-12-06 LAB — CBC WITH DIFFERENTIAL/PLATELET
Basophil %: 0.2 %
Eosinophil %: 1.1 %
HCT: 27.1 % — ABNORMAL LOW (ref 35.0–47.0)
HGB: 8.3 g/dL — ABNORMAL LOW (ref 12.0–16.0)
Lymphocyte #: 0.3 10*3/uL — ABNORMAL LOW (ref 1.0–3.6)
MCH: 23.9 pg — ABNORMAL LOW (ref 26.0–34.0)
MCHC: 30.7 g/dL — ABNORMAL LOW (ref 32.0–36.0)
MCV: 78 fL — ABNORMAL LOW (ref 80–100)
Monocyte #: 0.7 x10 3/mm (ref 0.2–0.9)
Monocyte %: 13.4 %
Neutrophil %: 78.4 %
Platelet: 53 10*3/uL — ABNORMAL LOW (ref 150–440)
RBC: 3.48 10*6/uL — ABNORMAL LOW (ref 3.80–5.20)
WBC: 5.1 10*3/uL (ref 3.6–11.0)

## 2011-12-06 LAB — URINALYSIS, COMPLETE
Bilirubin,UR: NEGATIVE
Ketone: NEGATIVE
Protein: 100
RBC,UR: 818 /HPF (ref 0–5)
Specific Gravity: 1.016 (ref 1.003–1.030)
Squamous Epithelial: <1
WBC UR: 112 /HPF (ref 0–5)

## 2011-12-12 ENCOUNTER — Encounter: Payer: Self-pay | Admitting: Internal Medicine

## 2011-12-12 LAB — BASIC METABOLIC PANEL
Anion Gap: 8 (ref 7–16)
BUN: 9 mg/dL (ref 7–18)
Chloride: 95 mmol/L — ABNORMAL LOW (ref 98–107)
Co2: 30 mmol/L (ref 21–32)
Creatinine: 0.93 mg/dL (ref 0.60–1.30)
EGFR (African American): >60
EGFR (Non-African Amer.): >60
Glucose: 134 mg/dL — ABNORMAL HIGH (ref 65–99)
Sodium: 133 mmol/L — ABNORMAL LOW (ref 136–145)

## 2011-12-17 ENCOUNTER — Encounter: Payer: Self-pay | Admitting: Internal Medicine

## 2011-12-17 ENCOUNTER — Ambulatory Visit: Payer: Self-pay | Admitting: Oncology

## 2011-12-17 ENCOUNTER — Inpatient Hospital Stay: Payer: Self-pay | Admitting: Surgery

## 2011-12-17 LAB — CBC WITH DIFFERENTIAL/PLATELET
Basophil #: 0.1 10*3/uL (ref 0.0–0.1)
Basophil %: 2 %
HCT: 24.9 % — ABNORMAL LOW (ref 35.0–47.0)
HGB: 8 g/dL — ABNORMAL LOW (ref 12.0–16.0)
Lymphocyte %: 21.8 %
Monocyte %: 8.8 %
Neutrophil #: 4.3 10*3/uL (ref 1.4–6.5)
Neutrophil %: 65.5 %
RBC: 3.14 10*6/uL — ABNORMAL LOW (ref 3.80–5.20)
RDW: 32.2 % — ABNORMAL HIGH (ref 11.5–14.5)
WBC: 6.6 10*3/uL (ref 3.6–11.0)

## 2011-12-17 LAB — COMPREHENSIVE METABOLIC PANEL
Albumin: 1.4 g/dL — ABNORMAL LOW (ref 3.4–5.0)
Alkaline Phosphatase: 92 U/L (ref 50–136)
BUN: 10 mg/dL (ref 7–18)
Bilirubin,Total: 0.4 mg/dL (ref 0.2–1.0)
Calcium, Total: 6.2 mg/dL — CL (ref 8.5–10.1)
EGFR (African American): >60
EGFR (Non-African Amer.): >60
Glucose: 90 mg/dL (ref 65–99)
Osmolality: 272 (ref 275–301)
SGOT(AST): 26 U/L (ref 15–37)
Sodium: 137 mmol/L (ref 136–145)
Total Protein: 4.5 g/dL — ABNORMAL LOW (ref 6.4–8.2)

## 2011-12-18 LAB — BASIC METABOLIC PANEL
Anion Gap: 8 (ref 7–16)
BUN: 12 mg/dL (ref 7–18)
Chloride: 93 mmol/L — ABNORMAL LOW (ref 98–107)
Co2: 31 mmol/L (ref 21–32)
Creatinine: 1.02 mg/dL (ref 0.60–1.30)
EGFR (African American): >60
EGFR (Non-African Amer.): 55 — ABNORMAL LOW
Potassium: 3.8 mmol/L (ref 3.5–5.1)
Sodium: 132 mmol/L — ABNORMAL LOW (ref 136–145)

## 2011-12-19 LAB — BASIC METABOLIC PANEL
Anion Gap: 7 (ref 7–16)
Calcium, Total: 7.4 mg/dL — ABNORMAL LOW (ref 8.5–10.1)
Creatinine: 0.94 mg/dL (ref 0.60–1.30)
EGFR (African American): >60
EGFR (Non-African Amer.): >60
Glucose: 128 mg/dL — ABNORMAL HIGH (ref 65–99)
Sodium: 132 mmol/L — ABNORMAL LOW (ref 136–145)

## 2011-12-19 LAB — CBC WITH DIFFERENTIAL/PLATELET
Eosinophil %: 4.8 %
Lymphocyte %: 24.1 %
Monocyte %: 11.9 %
Neutrophil %: 57.9 %
Platelet: 71 10*3/uL — ABNORMAL LOW (ref 150–440)
RBC: 3.75 10*6/uL — ABNORMAL LOW (ref 3.80–5.20)
WBC: 4.4 10*3/uL (ref 3.6–11.0)

## 2011-12-22 LAB — URINALYSIS, COMPLETE
Bilirubin,UR: NEGATIVE
Glucose,UR: NEGATIVE mg/dL (ref 0–75)
Ketone: NEGATIVE
Ph: 7 (ref 4.5–8.0)
Protein: NEGATIVE
RBC,UR: 1 /HPF (ref 0–5)
Squamous Epithelial: 7
WBC UR: 6 /HPF (ref 0–5)

## 2011-12-22 LAB — BASIC METABOLIC PANEL
Anion Gap: 9 (ref 7–16)
Calcium, Total: 7.5 mg/dL — ABNORMAL LOW (ref 8.5–10.1)
Chloride: 101 mmol/L (ref 98–107)
Co2: 25 mmol/L (ref 21–32)
Creatinine: 1.38 mg/dL — ABNORMAL HIGH (ref 0.60–1.30)
EGFR (African American): 44 — ABNORMAL LOW
EGFR (Non-African Amer.): 38 — ABNORMAL LOW
Glucose: 131 mg/dL — ABNORMAL HIGH (ref 65–99)
Osmolality: 272 (ref 275–301)
Sodium: 135 mmol/L — ABNORMAL LOW (ref 136–145)

## 2011-12-22 LAB — CBC WITH DIFFERENTIAL/PLATELET
Basophil %: 0.6 %
Eosinophil %: 2.3 %
HCT: 30.5 % — ABNORMAL LOW (ref 35.0–47.0)
HGB: 9.8 g/dL — ABNORMAL LOW (ref 12.0–16.0)
Lymphocyte #: 1.3 10*3/uL (ref 1.0–3.6)
Lymphocyte %: 20.1 %
MCH: 25.6 pg — ABNORMAL LOW (ref 26.0–34.0)
MCV: 80 fL (ref 80–100)
Monocyte %: 10.3 %
Neutrophil %: 66.7 %
Platelet: 111 10*3/uL — ABNORMAL LOW (ref 150–440)
RBC: 3.83 10*6/uL (ref 3.80–5.20)
WBC: 6.4 10*3/uL (ref 3.6–11.0)

## 2011-12-24 LAB — URINE CULTURE

## 2011-12-25 LAB — CBC WITH DIFFERENTIAL/PLATELET
Basophil #: 0 10*3/uL (ref 0.0–0.1)
Basophil %: 0.2 %
Eosinophil #: 0 10*3/uL (ref 0.0–0.7)
Eosinophil %: 0 %
HCT: 35.9 % (ref 35.0–47.0)
HGB: 11.6 g/dL — ABNORMAL LOW (ref 12.0–16.0)
Lymphocyte #: 1.4 10*3/uL (ref 1.0–3.6)
Lymphocyte %: 6.3 %
MCH: 25.7 pg — ABNORMAL LOW (ref 26.0–34.0)
MCHC: 32.4 g/dL (ref 32.0–36.0)
MCV: 80 fL (ref 80–100)
Neutrophil #: 19 10*3/uL — ABNORMAL HIGH (ref 1.4–6.5)
RDW: 30.7 % — ABNORMAL HIGH (ref 11.5–14.5)
WBC: 21.3 10*3/uL — ABNORMAL HIGH (ref 3.6–11.0)

## 2011-12-25 LAB — URINALYSIS, COMPLETE
Blood: NEGATIVE
Ketone: NEGATIVE
Nitrite: NEGATIVE
Ph: 6 (ref 4.5–8.0)
Protein: NEGATIVE
RBC,UR: NONE SEEN /HPF (ref 0–5)
Squamous Epithelial: NONE SEEN
WBC UR: 6 /HPF (ref 0–5)

## 2011-12-25 LAB — AMYLASE: Amylase: 80 U/L (ref 25–115)

## 2011-12-25 LAB — COMPREHENSIVE METABOLIC PANEL
Albumin: 2 g/dL — ABNORMAL LOW (ref 3.4–5.0)
Alkaline Phosphatase: 121 U/L (ref 50–136)
BUN: 23 mg/dL — ABNORMAL HIGH (ref 7–18)
Bilirubin,Total: 0.4 mg/dL (ref 0.2–1.0)
Chloride: 99 mmol/L (ref 98–107)
Co2: 16 mmol/L — ABNORMAL LOW (ref 21–32)
Creatinine: 2.61 mg/dL — ABNORMAL HIGH (ref 0.60–1.30)
EGFR (Non-African Amer.): 18 — ABNORMAL LOW
Glucose: 179 mg/dL — ABNORMAL HIGH (ref 65–99)
Osmolality: 265 (ref 275–301)
SGOT(AST): 13 U/L — ABNORMAL LOW (ref 15–37)
SGPT (ALT): 16 U/L (ref 12–78)
Sodium: 128 mmol/L — ABNORMAL LOW (ref 136–145)
Total Protein: 6.3 g/dL — ABNORMAL LOW (ref 6.4–8.2)

## 2011-12-26 LAB — COMPREHENSIVE METABOLIC PANEL
Albumin: 1.7 g/dL — ABNORMAL LOW (ref 3.4–5.0)
Anion Gap: 11 (ref 7–16)
BUN: 16 mg/dL (ref 7–18)
Bilirubin,Total: 0.3 mg/dL (ref 0.2–1.0)
Chloride: 108 mmol/L — ABNORMAL HIGH (ref 98–107)
Co2: 21 mmol/L (ref 21–32)
Creatinine: 1.55 mg/dL — ABNORMAL HIGH (ref 0.60–1.30)
EGFR (African American): 38 — ABNORMAL LOW
EGFR (Non-African Amer.): 33 — ABNORMAL LOW
Osmolality: 281 (ref 275–301)
Sodium: 140 mmol/L (ref 136–145)

## 2011-12-26 LAB — CBC WITH DIFFERENTIAL/PLATELET
Basophil %: 0.3 %
Eosinophil #: 0 10*3/uL (ref 0.0–0.7)
Eosinophil %: 0.3 %
HCT: 29.6 % — ABNORMAL LOW (ref 35.0–47.0)
Lymphocyte #: 1.3 10*3/uL (ref 1.0–3.6)
Lymphocyte %: 14.5 %
Monocyte %: 9.8 %
Neutrophil #: 6.5 10*3/uL (ref 1.4–6.5)
Neutrophil %: 75.1 %
RBC: 3.72 10*6/uL — ABNORMAL LOW (ref 3.80–5.20)
WBC: 8.6 10*3/uL (ref 3.6–11.0)

## 2011-12-26 LAB — PLATELET COUNT: Platelet: 163 10*3/uL (ref 150–440)

## 2011-12-27 LAB — COMPREHENSIVE METABOLIC PANEL
Albumin: 1.6 g/dL — ABNORMAL LOW (ref 3.4–5.0)
Anion Gap: 8 (ref 7–16)
BUN: 7 mg/dL (ref 7–18)
Calcium, Total: 6.8 mg/dL — CL (ref 8.5–10.1)
Chloride: 108 mmol/L — ABNORMAL HIGH (ref 98–107)
Co2: 26 mmol/L (ref 21–32)
EGFR (African American): 59 — ABNORMAL LOW
Glucose: 110 mg/dL — ABNORMAL HIGH (ref 65–99)
Osmolality: 282 (ref 275–301)
Potassium: 2.6 mmol/L — ABNORMAL LOW (ref 3.5–5.1)
SGOT(AST): 13 U/L — ABNORMAL LOW (ref 15–37)
SGPT (ALT): 9 U/L — ABNORMAL LOW (ref 12–78)

## 2011-12-27 LAB — CBC WITH DIFFERENTIAL/PLATELET
Eosinophil: 2 %
HGB: 9.8 g/dL — ABNORMAL LOW (ref 12.0–16.0)
Lymphocytes: 12 %
MCH: 27.2 pg (ref 26.0–34.0)
MCHC: 33.9 g/dL (ref 32.0–36.0)
MCV: 80 fL (ref 80–100)
Monocytes: 11 %
Myelocyte: 1 %
NRBC/100 WBC: 2 /
Other Cells Blood: 3
RBC: 3.6 10*6/uL — ABNORMAL LOW (ref 3.80–5.20)
RDW: 31.3 % — ABNORMAL HIGH (ref 11.5–14.5)
Segmented Neutrophils: 69 %

## 2011-12-27 LAB — URINE CULTURE

## 2011-12-28 LAB — COMPREHENSIVE METABOLIC PANEL
Albumin: 1.6 g/dL — ABNORMAL LOW (ref 3.4–5.0)
Alkaline Phosphatase: 96 U/L (ref 50–136)
Anion Gap: 7 (ref 7–16)
BUN: 5 mg/dL — ABNORMAL LOW (ref 7–18)
Calcium, Total: 6.7 mg/dL — CL (ref 8.5–10.1)
Chloride: 107 mmol/L (ref 98–107)
Co2: 26 mmol/L (ref 21–32)
Creatinine: 0.79 mg/dL (ref 0.60–1.30)
EGFR (Non-African Amer.): >60
Glucose: 97 mg/dL (ref 65–99)
Osmolality: 277 (ref 275–301)
Potassium: 3.2 mmol/L — ABNORMAL LOW (ref 3.5–5.1)
SGPT (ALT): 10 U/L — ABNORMAL LOW (ref 12–78)

## 2011-12-28 LAB — CBC WITH DIFFERENTIAL/PLATELET
Basophil #: 0 10*3/uL (ref 0.0–0.1)
Basophil %: 0.6 %
Eosinophil %: 1.2 %
HCT: 28.7 % — ABNORMAL LOW (ref 35.0–47.0)
Lymphocyte #: 1.2 10*3/uL (ref 1.0–3.6)
Lymphocyte %: 19.3 %
MCH: 26.1 pg (ref 26.0–34.0)
MCV: 80 fL (ref 80–100)
Monocyte #: 0.7 x10 3/mm (ref 0.2–0.9)
Neutrophil %: 67.8 %
Platelet: 159 10*3/uL (ref 150–440)
RBC: 3.59 10*6/uL — ABNORMAL LOW (ref 3.80–5.20)
RDW: 30.4 % — ABNORMAL HIGH (ref 11.5–14.5)
WBC: 6.3 10*3/uL (ref 3.6–11.0)

## 2011-12-29 LAB — VANCOMYCIN, TROUGH: Vancomycin, Trough: 4 ug/mL — ABNORMAL LOW (ref 10–20)

## 2012-01-11 ENCOUNTER — Other Ambulatory Visit: Payer: Self-pay | Admitting: Surgery

## 2012-01-11 LAB — URINALYSIS, COMPLETE
Bilirubin,UR: NEGATIVE
Protein: 30
RBC,UR: 164 /HPF (ref 0–5)
Specific Gravity: 1.012 (ref 1.003–1.030)
Squamous Epithelial: 1
WBC UR: 500 /HPF (ref 0–5)

## 2012-01-17 DEATH — deceased

## 2013-02-14 IMAGING — CR DG LUMBAR SPINE 2-3V
1 series · 3 of 3 positions shown · non-contrast
Comparison: none

REASON FOR EXAM: back pain - acute onset - hx of colon ca - eval for comp
fx/metastatic disease
COMMENTS:

PROCEDURE:     DXR - DXR LUMBAR SPINE AP AND LATERAL  - March 18, 2010  [DATE]
RESULT:     Comparison: None

[Series 1: view not recorded · 0.17mm/px · 3 of 3 slices shown]
[im 1/3]
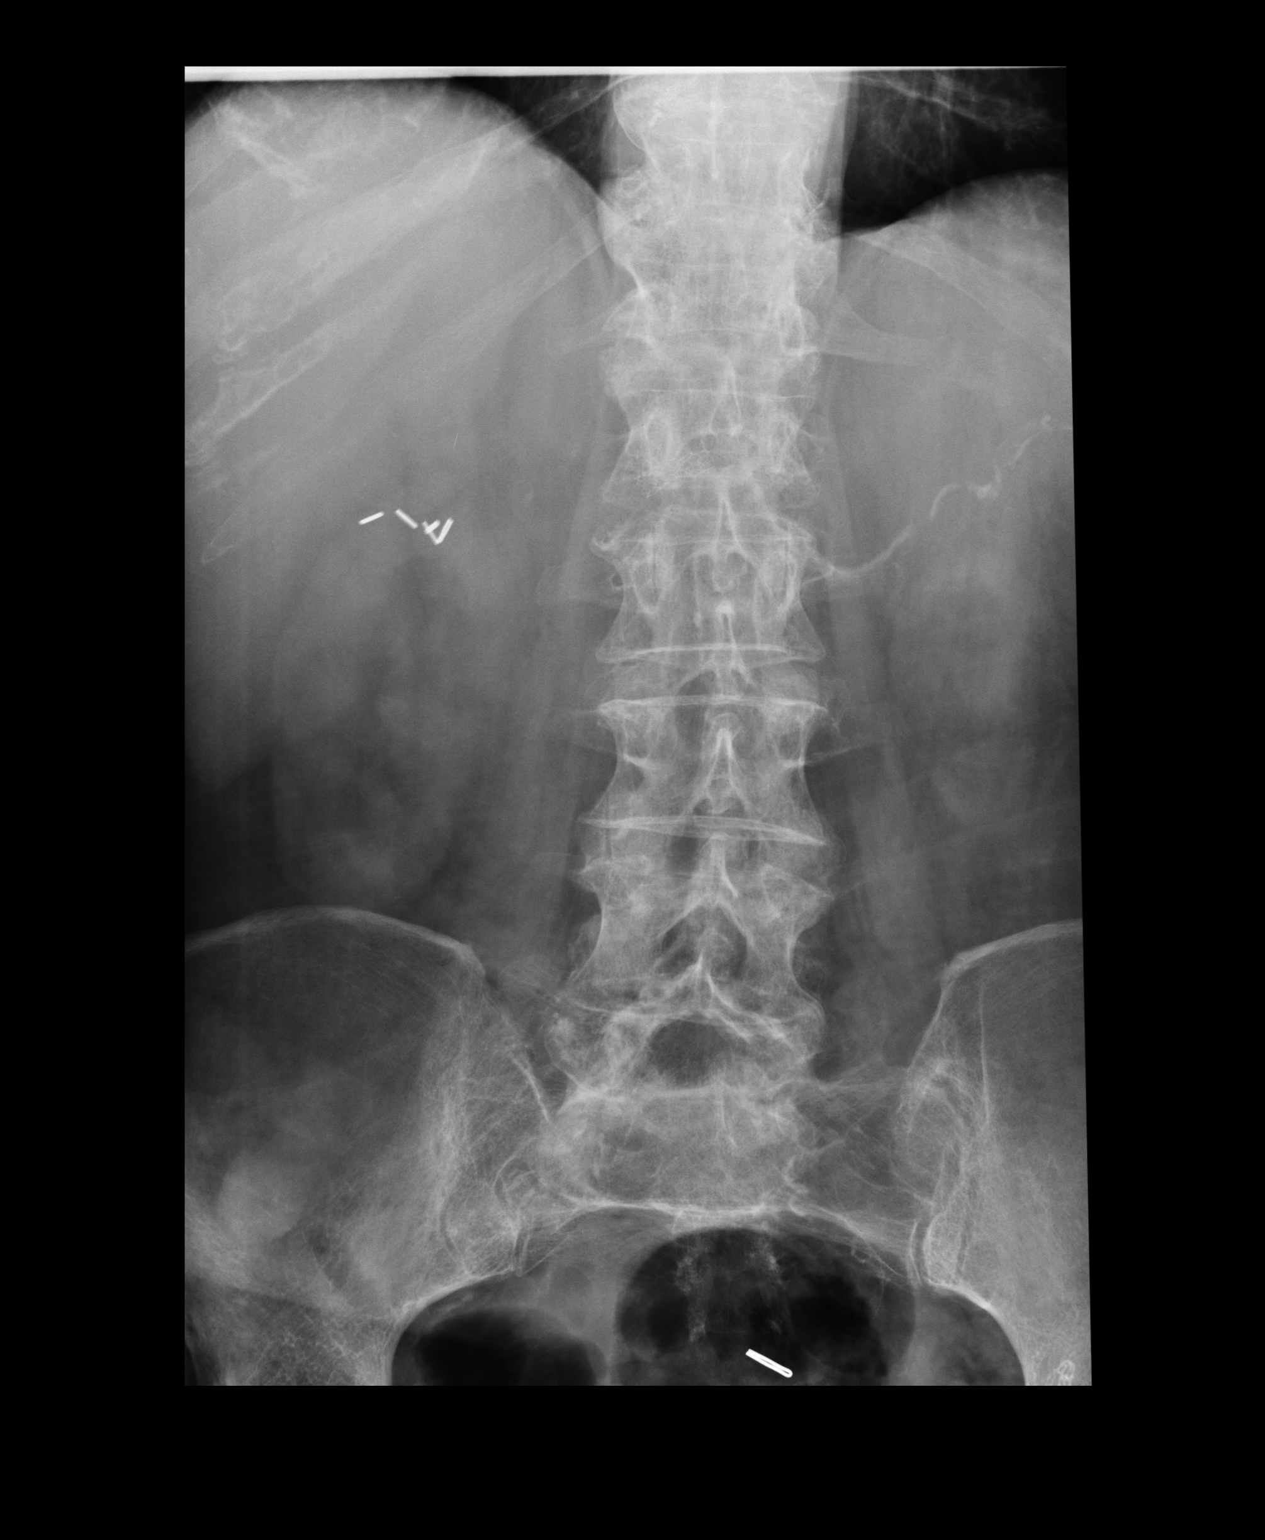
[im 2/3]
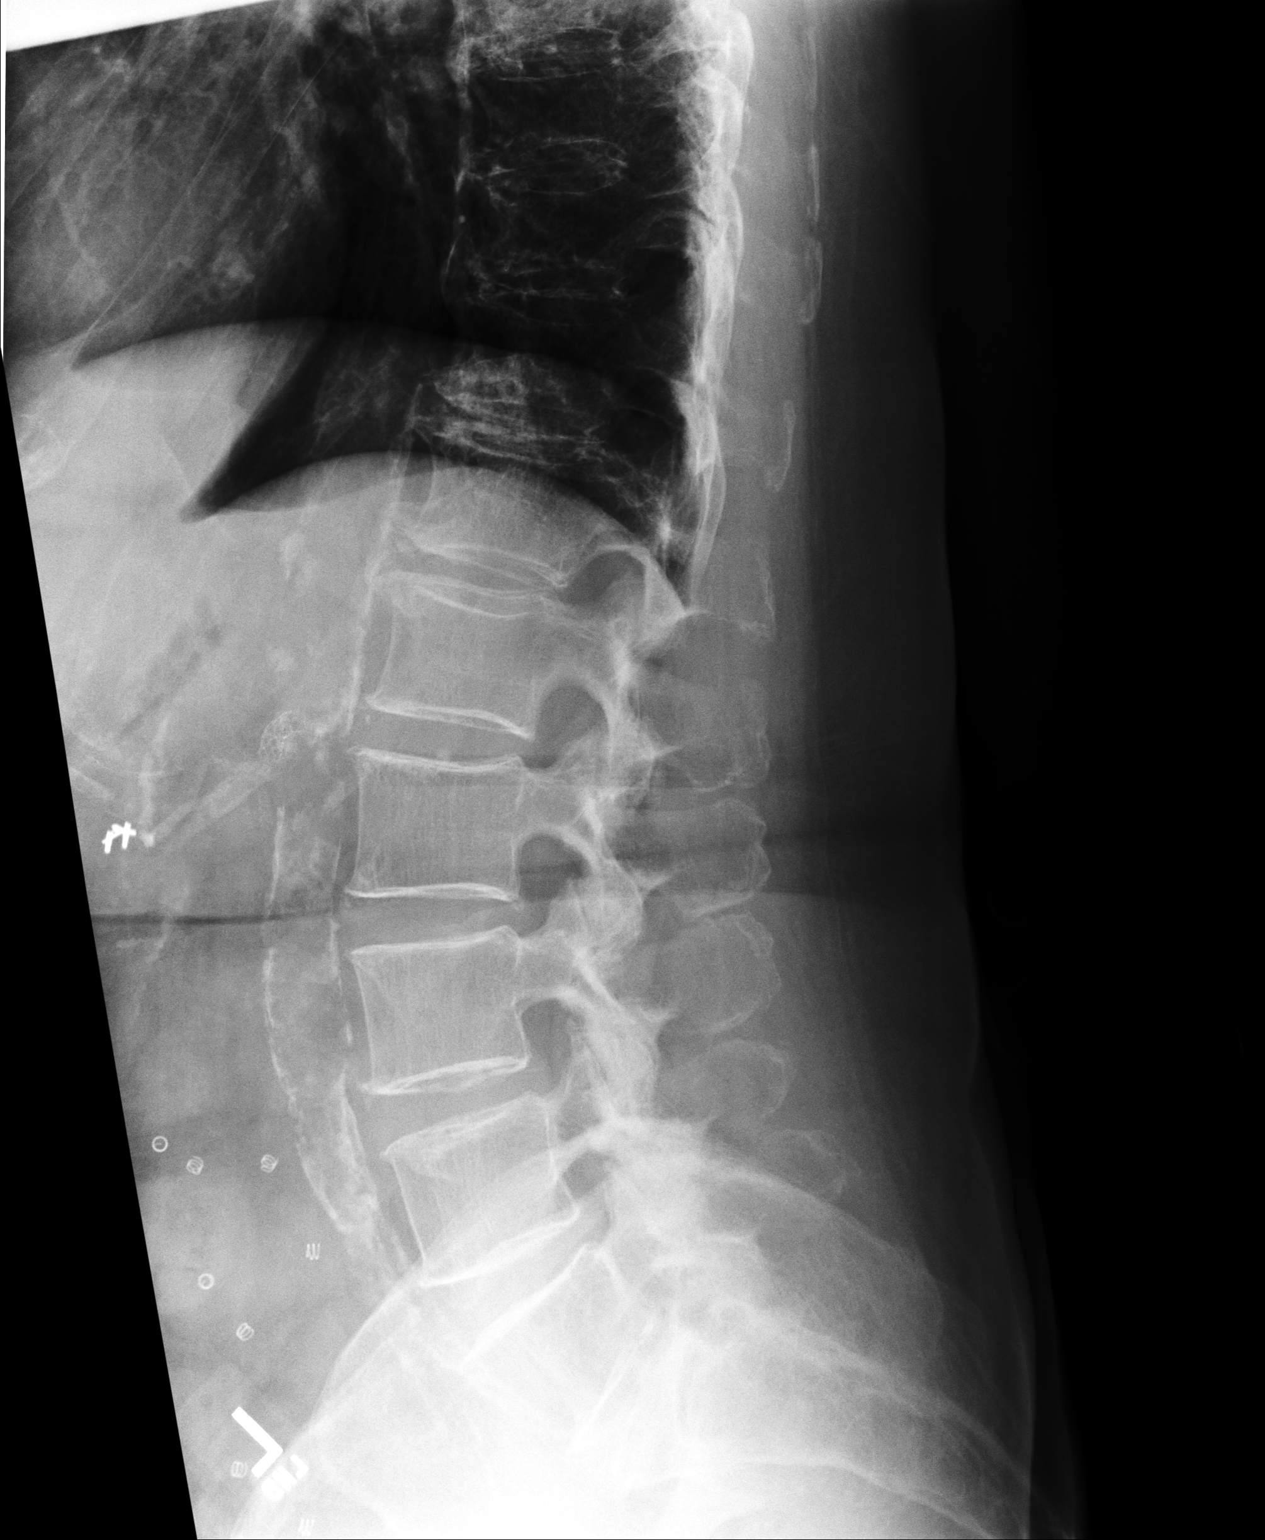
[im 3/3]
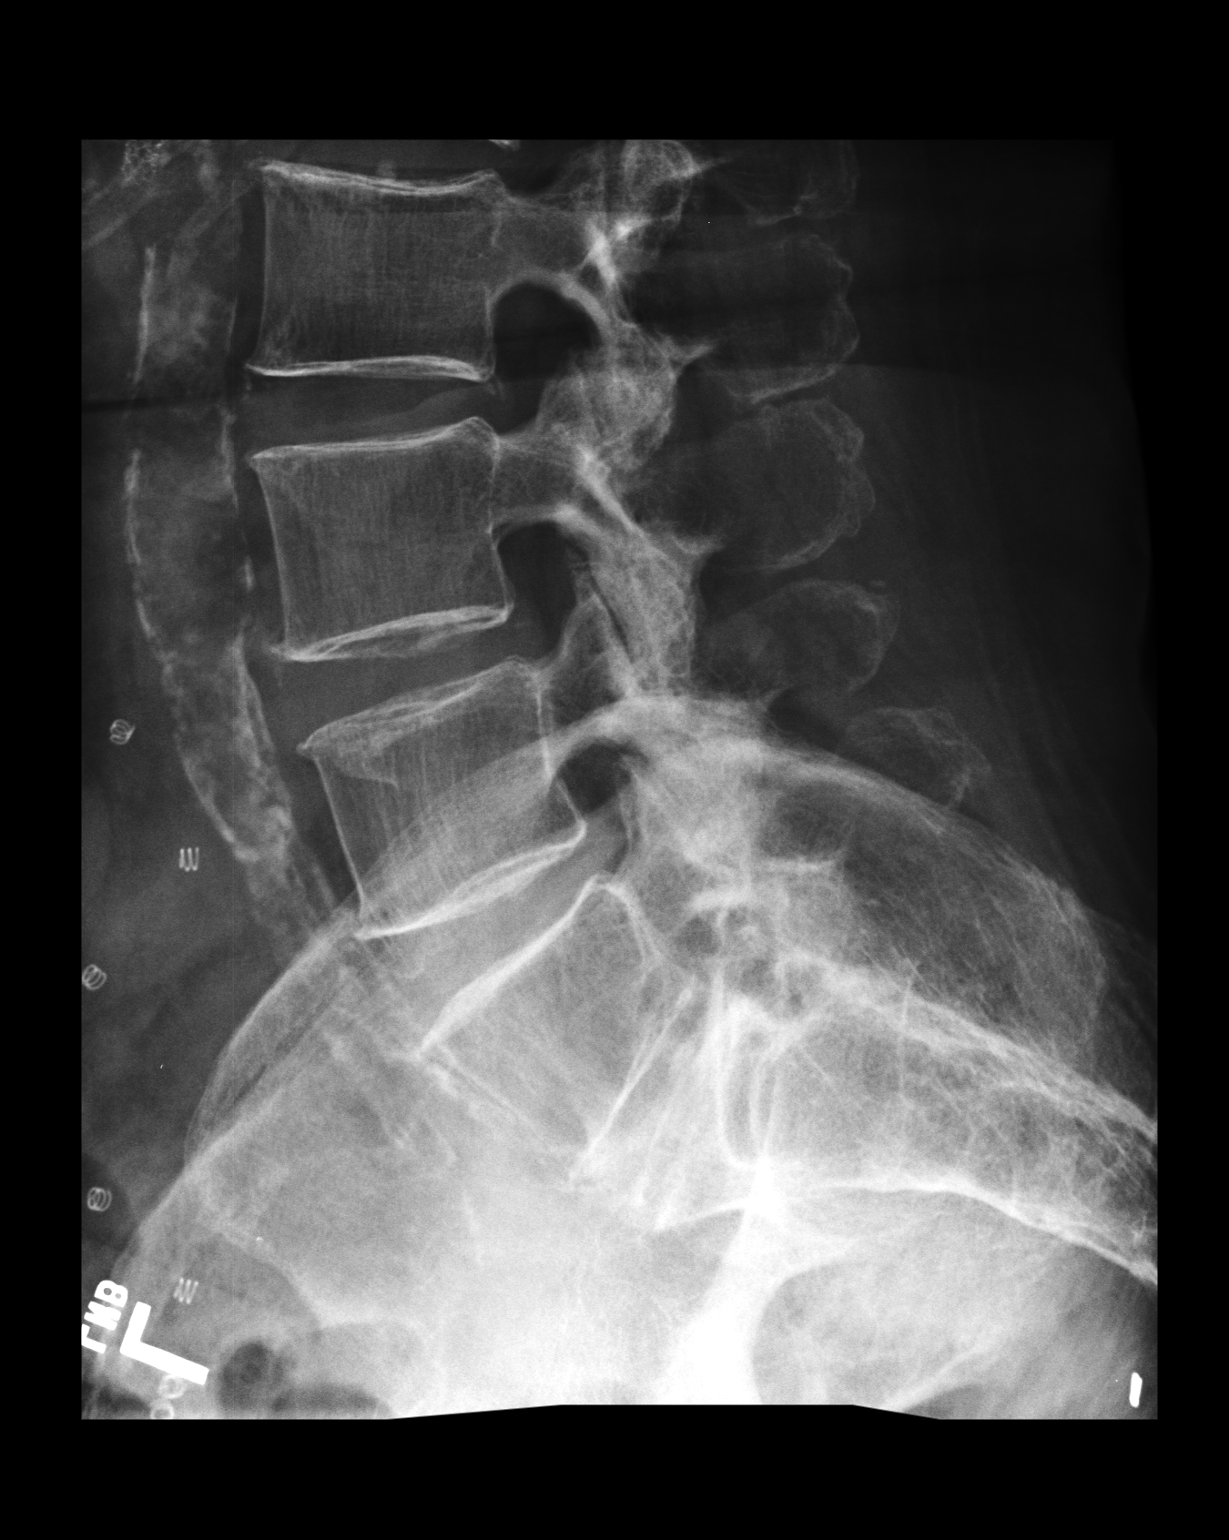

[3 of 3 positions shown; findings below may reference images not displayed]

FINDINGS: AP and lateral views of the lumbar spine and a coned down view of the
lumbosacral junction are provided.

There are 5 nonrib bearing lumbar-type vertebral bodies. The vertebral body
heights are maintained. The alignment is anatomic. There is no
spondylolysis. There is no acute fracture or static listhesis. The disc
spaces are maintained. There is lumbar spine facet arthropathy.

The SI joints are unremarkable.

There is abdominal aortic atherosclerosis.
IMPRESSION: 1. No acute osseous abnormality of the lumbar spine.

## 2013-07-03 IMAGING — CR DG LUMBAR SPINE 2-3V
1 series · 2 of 2 positions shown · non-contrast
Comparison: none

REASON FOR EXAM: upright backpain hx:of compression fracture  alignment
COMMENTS:

PROCEDURE:     DXR - DXR LUMBAR SPINE AP AND LATERAL  - August 04, 2010  [DATE]
RESULT:     Comparison: None

[Series 1: view not recorded · 0.17mm/px · 2 of 2 slices shown]
[im 1/2]
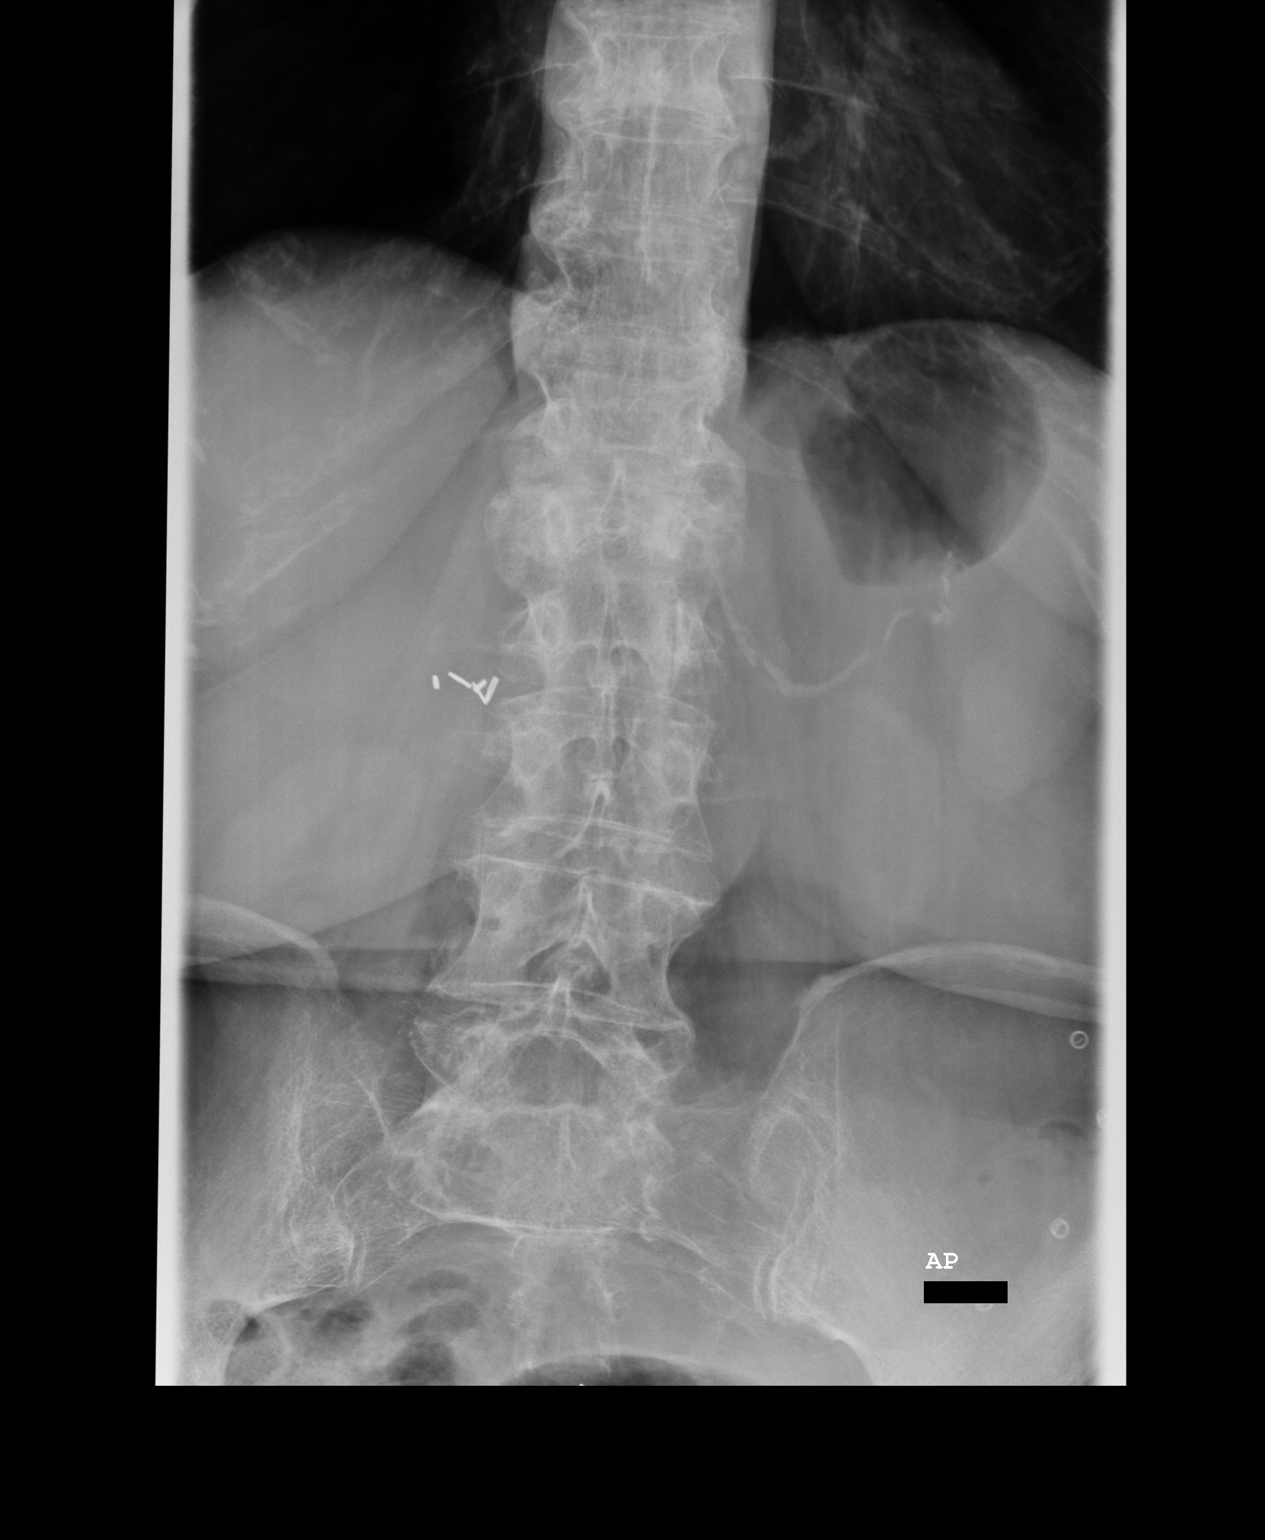
[im 2/2]
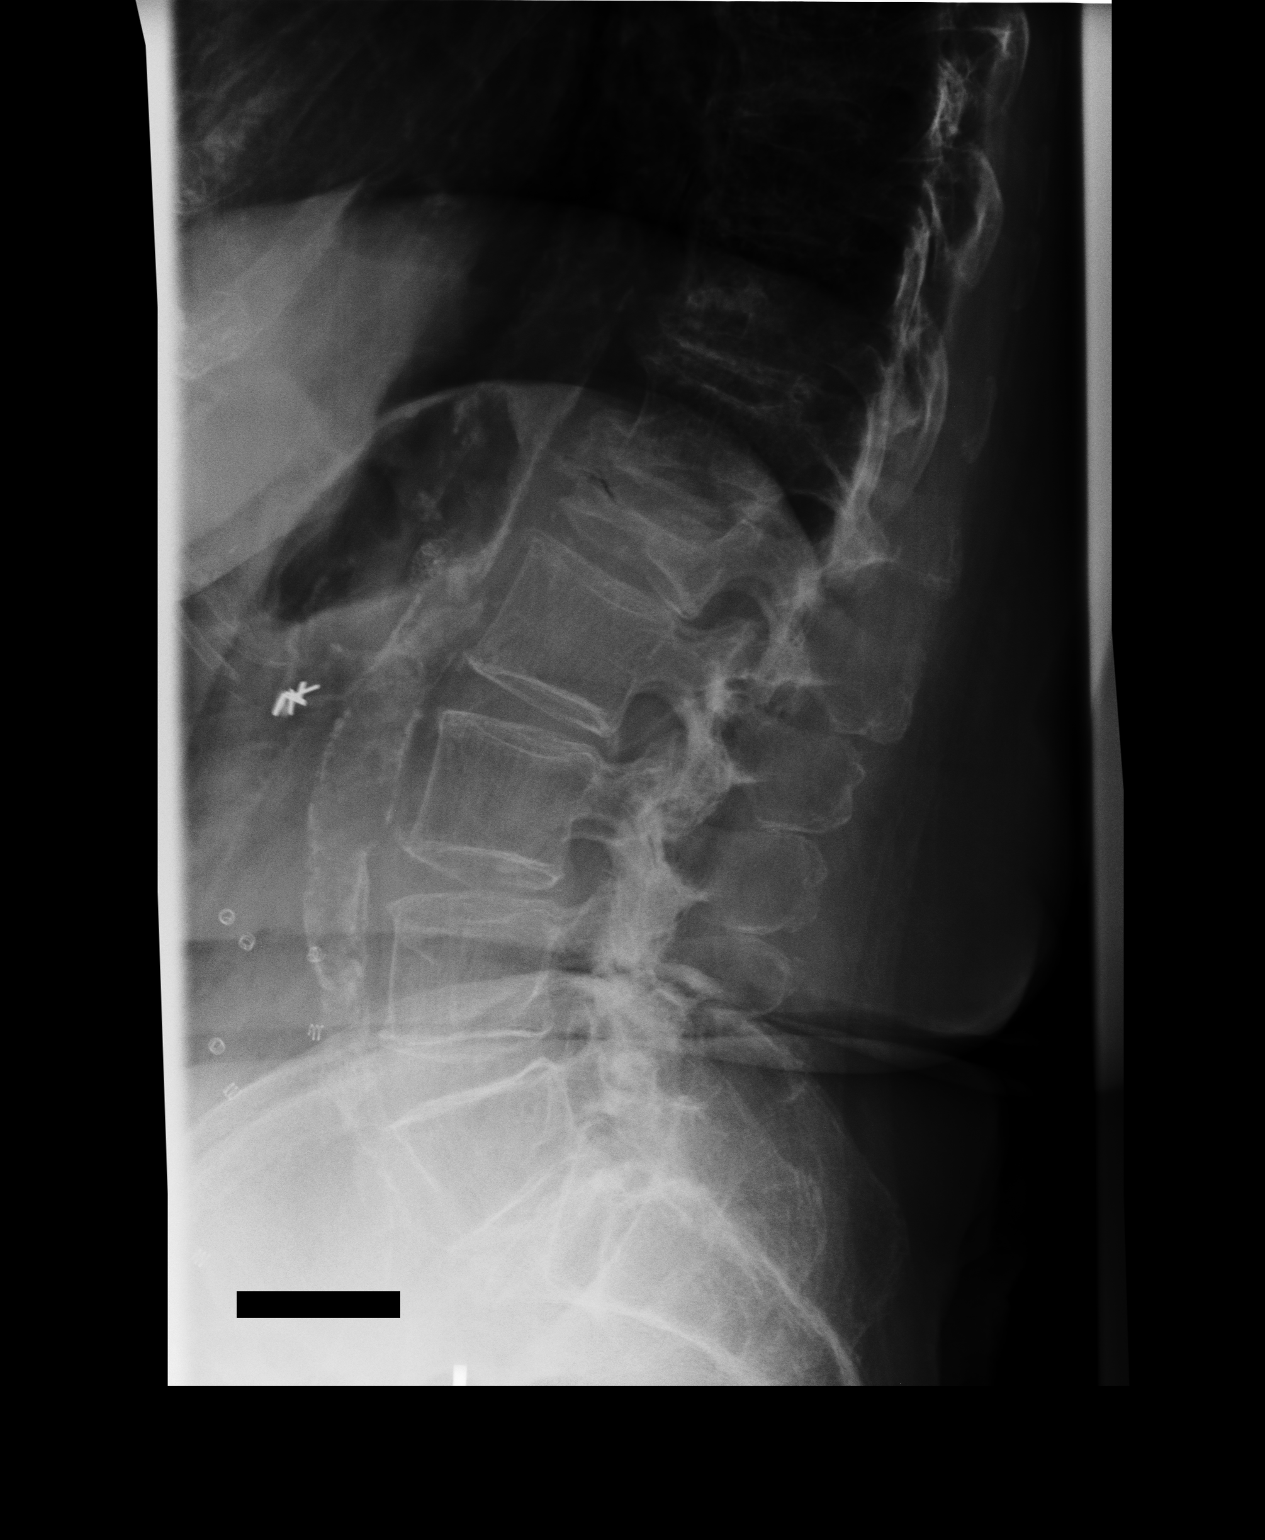

[2 of 2 positions shown; findings below may reference images not displayed]

FINDINGS: AP and lateral views of the lumbar spine and a coned down view of the
lumbosacral junction are provided.

There are 5 nonrib bearing lumbar-type vertebral bodies. There is severe
osteopenia. There is an old L1 vertebral body compression fracture with
approximately 80% height loss. The remainder the vertebral body heights are
maintained. The alignment is anatomic. There is no spondylolysis. There is
no acute fracture or static listhesis. The disc spaces are maintained.

The SI joints are unremarkable.
IMPRESSION: Chronic L1 vertebral body compression fracture.

## 2014-05-05 NOTE — Discharge Summary (Signed)
PATIENT NAME:  Marissa DrapeBERCROMBIE, Nija L MR#:  [id number] DATE OF BIRTH:  1935/08/19  DATE OF ADMISSION:  11/23/2011 DATE OF DISCHARGE:  12/07/2011  ADDENDUM   DISCHARGE MEDICATIONS:  1. Percocet 1 to 2 tabs p.o. q.4 hours p.r.n. pain. 2. Norco 1 to 2 tabs p.o. q.4 hours p.r.n. pain.   ____________________________ Si Raiderhristopher A. Teisha Trowbridge, MD cal:cms D: 12/07/2011 14:58:11 ET T: 12/07/2011 15:31:24 ET JOB#: [id number]  cc: Cristal Deerhristopher A. Zamauri Nez, MD, <Dictator> Jarvis NewcomerHRISTOPHER A Anabella Capshaw MD ELECTRONICALLY SIGNED 12/11/2011 8:13

## 2014-05-05 NOTE — Consult Note (Signed)
ONCOLOGY followup - still weak, states that she is trying to eat better.  Denies bleeding symptoms.No fevers. resting in bed, weak and tired looking, NAD           vitals -  afebrile, stable           lungs - decreased breath sounds at bases           abdomen - soft, mild generalized tenderness           skin - no major bruising or petechiaeWBC 9100, hemoglobin 8.1, platelets 51, nucleated red blood cells present.  INR 1.2, PTT 35.2, fibrinogen 452, FDP 10 - 40. Patient with known h/o colon cancer currently s/p surgical intervention for SBO, having recurrent progressive thrombocytopenia. Likely etiology for worsening of baseline thrombocytopenia is recent acute illness with evidence of mild DIC, surgery, possible medication (agree with avoiding heparin until HIPA result is available). Peripheral smear does not show increased schistocytes, no renal insufficency or fevers. No evidence of bleeding symptoms. Continue to monitor CBC, recommend platelet transfusion if platelet count is < 20K or if she develops major bleeding issues. Patient also has iron def anemia and has received ferrous gluconate 125 mg IV on 11/10. Will continue to follow. Patient and daughter present explained above and are agreeable to this plan.  Electronic Signatures: Izola PricePandit, Allisyn Kunz Raj (MD)  (Signed on 16-Nov-13 12:53)  Authored  Last Updated: [id number]: 16-Nov-13 12:53 by Izola PricePandit, Kadeja Granada Raj (MD)

## 2014-05-05 NOTE — Consult Note (Signed)
Brief Consult Note: Diagnosis: sbo.   Patient was seen by consultant.   Consult note dictated.   Recommend to proceed with surgery or procedure.   Comments: 79 yo female with h/o multiplesurgeries. comes with a sbo. she has ct showing a abd wall hernia causing focal obstruction.  ask by surgery to help with medical issues.  1. HTN add IV meds scheduled 2. DM: off meds for 3 years. monitor bg while npo 3. cva: h/o cva while in OR for his colon cancer, pt is on asa, will cont after tol PO . 4. anemia of chronic dz also previous bleeding thru colostomy, avoid NSAIDS.  thank you  for this consult. adn for allowing me to participate on the care of this nice patient.  Electronic Signatures: Berlinda LastSanchez Gutierrez, Regan Rakersoberto (MD)  (Signed 615 [id number] 369507-Nov-13 21:05)  Authored: Brief Consult Note   Last Updated: 07-Nov-13 21:05 by Berlinda LastSanchez Gutierrez, Regan Rakersoberto (MD)

## 2014-05-05 NOTE — Op Note (Signed)
PATIENT NAME:  Marissa Patton, Marissa Patton MR#:  [id number] DATE OF BIRTH:  1935-08-25  DATE OF PROCEDURE:  12/19/2011  PREOPERATIVE DIAGNOSIS: Wound infection with exposed mesh.   POSTOPERATIVE DIAGNOSIS: Wound infection with exposed mesh.   OPERATION: Wound exploration and Wound VAC placement.   ANESTHESIA: General.   SURGEON: Quentin Orealph Patton. Ely, III, MD    OPERATIVE PROCEDURE: With the patient in the supine position after induction of appropriate general anesthesia, the patient's abdomen was prepped with Betadine and draped with sterile towels. The area was irrigated and any purulence washed out. The repair appeared to be intact although the mesh was clearly infected. The wound was opened a little more extensively to allow for less undermining. Layer of Adaptic was placed over the mesh. White foam was placed over the Adaptic extending under the small areas of undermining and then black foam placed in standard fashion over top of the white foam. The Wound VAC adhered appropriately with no evidence of a leak. The patient was returned to the recovery room having tolerated the procedure well. Sponge, instrument, and needle counts were correct x2 in the operating room.    ____________________________ [name], MD rle:drc D: 12/19/2011 12:02:11 ET T: 12/19/2011 12:22:34 ET JOB#: [id number]  cc: [name], MD, <Dictator> [name] ELECTRONICALLY SIGNED 12/19/2011 18:36

## 2014-05-05 NOTE — H&P (Signed)
Subjective/Chief Complaint stool drainage from wound VAC    History of Present Illness 79 year old female with complex past surgical history s/p ex lap 11/8 with small bowel resection for closed loop obstruction and closure of lower abdominal wall with vicryl mesh.  Had wound breakdown in last 10 days while at [place] with aplication of VAC.  I saw her in office on tuesday and VAC changed with apadtic and black foam.  The opening measured about 3 cm by 4 cm at that time.  Comes to ER with report of stool drainng from wound.  Surgery asked to see.    Past History colon cancer, rectal cancer, ventral hernia, hypertension.    Past Medical Health Hypertension   Past Med/Surgical Hx:  Hypertension:   Hypothyroidism:   Diabetes:   CVA:   HTN:   anemia:   colon cancer:   Hernia Repair:   Colectomy:   ALLERGIES:  No Known Allergies:   HOME MEDICATIONS: Medication Instructions Status  potassium chloride 10 mEq oral tablet, extended release 1 tab(s) orally 2 times a day for potassium supplement. Active  alendronate 70 mg oral tablet 1 tab(s) orally once a week on Sunday for osteoporosis. Active  amlodipine 5 mg oral tablet 1 tab(s) orally once a day for htn. Active  aspirin 81 mg oral tablet 1 tab(s) orally once a day for anticoagulation. Active  Calcarb with D 600 mg-400 intl units oral tablet 1 tab(s) orally once a day for calcium supplement. Active  Ester-C with bioflavonoids 500 mg tablet 1 tab(s) orally once a day Active  Fish Oil 1000 mg oral capsule 1 cap(s) orally 3 times a day for heart health. Active  levothyroxine 50 mcg (0.05 mg) oral tablet 1 tab(s) orally once a day for thyroid. Active  metoprolol succinate 25 mg oral tablet, extended release 1 tab(s) orally once a day for htn. Active  Vitamin D3 1000 intl units oral tablet 1 tab(s) orally once a day for Vitamin D supplement. Active  torsemide 10 mg oral tablet 1 tab(s) orally once a day Active   acetaminophen-hydrocodone 325 mg-5 mg oral tablet 1 to 2 tab(s) orally every 4 hours as needed for pain.  Active   Family and Social History:   Family History Non-Contributory    Social History negative tobacco    Place of Living at [facility] facility currently.   Review of Systems:   Subjective/Chief Complaint see above    Fever/Chills No    Cough No    Sputum No    Abdominal Pain No    Nausea/Vomiting No    Tolerating Diet Yes    Medications/Allergies Reviewed Medications/Allergies reviewed   Physical Exam:   GEN obese, disheveled    HEENT pale conjunctivae    NECK supple    RESP normal resp effort    CARD regular rate    ABD denies tenderness  3-4 cm open wound at lowest portion of midline with exposed vicryl mesh, no granulation tissue seen  I see no evidence of enteric fistula..    GU foley catheter in place    SKIN normal to palpation    NEURO cranial nerves intact    PSYCH A+O to time, place, person   Lab Results: Hepatic:  01-Dec-13 18:08    Bilirubin, Total 0.4   Alkaline Phosphatase 92   SGPT (ALT) 17   SGOT (AST) 26   Total Protein, Serum  4.5   Albumin, Serum  1.4  Routine Chem:  01-Dec-13 18:08    Glucose, Serum 90   BUN 10   Creatinine (comp) 0.81   Sodium, Serum 137   Potassium, Serum  3.1   Chloride, Serum 102   CO2, Serum 27   Calcium (Total), Serum  6.2   Osmolality (calc) 272   eGFR (African American) >60   eGFR (Non-African American) >60 (eGFR values <52m/min/1.73 m2 may be an indication of chronic kidney disease (CKD). Calculated eGFR is useful in patients with stable renal function. The eGFR calculation will not be reliable in acutely ill patients when serum creatinine is changing rapidly. It is not useful in  patients on dialysis. The eGFR calculation may not be applicable to patients at the low and high extremes of body sizes, pregnant women, and vegetarians.)   Result Comment CALCIUM - RESULTS VERIFIED BY REPEAT  TESTING.  - NOTIFIED OF CRITICAL VALUE  - C/MINDY HALE AT 1Tripp12/1/13.PMH  - READ-BACK PROCESS PERFORMED.  Result(s) reported on 17 Dec 2011 at 06:40PM.   Anion Gap 8  Routine Hem:  01-Dec-13 18:08    WBC (CBC) 6.6   RBC (CBC)  3.14   Hemoglobin (CBC)  8.0   Hematocrit (CBC)  24.9   Platelet Count (CBC)  55   MCV  79   MCH  25.4   MCHC 32.0   RDW  32.2   Neutrophil % 65.5   Lymphocyte % 21.8   Monocyte % 8.8   Eosinophil % 1.9   Basophil % 2.0   Neutrophil # 4.3   Monocyte # 0.6   Eosinophil # 0.1   Basophil # 0.1 (Result(s) reported on 17 Dec 2011 at 06:40PM.)     Assessment/Admission Diagnosis 79year old female with open wound on abdominal wall and severe protein malnutrition contributing to lack of wound healing.  I see no evidence at present of fistula.    Plan admit, wound consult, nutrition consult, PEG?  oral supplementations. for now will do some Dakin's wound care and allow re-assessment in am by Dr. EPat Patrickher surgeon with likely re-application of VAC Monday.  Malnutrition is a ajor problem.   Electronic Signatures: BSherri Rad(MD)  (Signed 01-Dec-13 19:02)  Authored: CHIEF COMPLAINT and HISTORY, PAST MEDICAL/SURGIAL HISTORY, ALLERGIES, HOME MEDICATIONS, FAMILY AND SOCIAL HISTORY, REVIEW OF SYSTEMS, PHYSICAL EXAM, LABS, ASSESSMENT AND PLAN   Last Updated: 01-Dec-13 19:02 by BSherri Rad(MD)

## 2014-05-05 NOTE — Discharge Summary (Signed)
PATIENT NAME:  Marissa Patton, Marissa Patton MR#:  [id number] DATE OF BIRTH:  01-Jun-1935  DATE OF ADMISSION:  12/17/2011 DATE OF DISCHARGE:  12/29/2011   DISPOSITION: The patient is transferred to rehab.   HOSPITAL COURSE: Please see the previous discharge summary dictated on 12/23/2011. Since that date she has done well with increasing energy and activity level. She has had several episodes of wound soilage from her colostomy which has been treated with Wound VAC. She is granulating on her abdominal wall and we anticipate that at rehab she will continue to improve over the next several weeks. Will plan eventual home health and office changing of her Wound VAC. She is discharged to [facility] today for rehab care and follow-up in our office in 7 to 10 days' time.   DISCHARGE MEDICATIONS:  1. Potassium chloride 10 mEq b.i.d.  2. Fosamax 70 mg once a week. 3. Amlodipine 5 mg once a day. 4. Aspirin 81 mg once a day.  5. Fish Oil 1000 mg once a day.  6. Levothyroxine 50 mcg once a day. 7. Metoprolol 25 mg extended-release once a day.  8. Vitamin D 1000 units once a day.  9. Torsemide 10 mg once a day. 10. Detrol LA 2 mg once a day. 11. Celexa 10 mg once a day.  12. Acetaminophen/hydrocodone 5/325 1 to 2 every four hours p.r.n.   FINAL DISCHARGE DIAGNOSIS: Small bowel obstruction with wound infection.   ____________________________ Marissa Patton. Patton III, Patton rle:drc D: 12/29/2011 09:00:38 ET T: 12/29/2011 09:19:27 ET JOB#: [id number]  cc: Marissa Patton. Patton III, Patton, <Dictator>, Duanne Limerickeanna C. Jones, Patton Marissa Patton ELECTRONICALLY SIGNED 12/29/2011 17:00

## 2014-05-05 NOTE — Consult Note (Signed)
ONCOLOGY Consultation note: OF CONSULT:  12/01/2011  REFERRING PHYSICIAN: Dr. Wynona MealsAhmadziaPHYSICIAN: Dr. Sherrlyn HockPandit  FOR CONSULT: Thrombocytopenia  [age] year old female with h/o recurrent colon cancer s/p colectomy and FOLFOX chemo in the past now admitted with abdominal pain. Plain films and CAT scan consistent with possible bowel obstruction. She has undergone exp lap and lysis of adhesion, surgical pathology report of small bowel specimen was negative for malignancy. Currently states she is still weak and slowly recovering. Platelet count has been decreasing since admission and is down to 53K today. Denies any obvious bleeding symtpoms. Per d/w Dr.Choksi, patient has had issues with mild thrombocytopenia in the past. No fevers.  medical hx/Past surgical hx -    Hypertension:    Hypothyroidism:    Diabetes:    CVA:    HTN:    anemia:    colon cancer:    Hernia Repair:    Colectomy:           Arthritis   Gallbladder surgery in 1982  Colostomy with rectal surgery for carcinoma of colon  Hernia surgery  Patient had a right renal artery stent placed by Dr. Guinevere ScarletBauman    Family History   History of colon and rectal cancer in the family.  History of blood clot, diabetes, heart disease, hypertension, anemia, and bleeding in the family.    Social History   Does not drink. Was smoking but quit smoking in 1982.  MEDICATIONS:  1. Fosamax 70 mg p.o. q. week.  2. Aspirin 81 mg p.o. daily.  Calcium p.o. daily.  Vitamin D3 1000 mg a day.  Synthroid 0.5 mcg daily.  Toprol-XL 25 mg p.o. daily.  Amlodipine 5 mg p.o. daily.  Fish oil t.i.d.  Ambien 5 mg p.o. daily.   ALLERGIES: She has no medical allergies.  HISTORY: She is retired and lives alone.  Has generalised weakness. No fever or chills. No headaces or focal weakness. Denies any confusion. No sore throat or dysphagia. No progressive cough, SOB at rest, hemoptysis or chest pain. No abd pain, diarrhea, dysuria. No new bone pains. No new paresthesias  in extremities. No seizures or LOC. No polyuria polydipsia. EXAM: weak looking, otherwise alert and oriented, NAD at rest. No icterus. SIGNS: afebrile, stable.EOMs intact. No cervical adenopathy.bilateral good BS, no rhonchi.S1S2, regular, systolic murmur. mildly distended, soft, mild generalised tenderness. b/l mild edema. No major bruising or ecchymosiscranial nerves intact, moves all extremities spontaneously Hb 10.4, WBC 10.4, ANC 9.0, platelets 53K, Cr 1.06, recent LFT unremarkable except for AST of 42 and albumin of 2.0  with known h/o colon cancer as described above currently s/p surgical intervention for SBO, having recurrent progressive thrombocytopenia. Likely etiology for worsening of baseline thrombocytopenia is recent acute illness, surgery, possible medication (agree with avoiding heparin until HIPA result is available). Will also check DIC panel.  Peripheral smear does not show increased schistocytes, no renal insufficency or fevers. No evidence of bleeding symptoms. Continue to monitor CBC, recommend platelet transfusion if platelet count is < 20K or if she develops major bleeding issues. Patient also has iron def anemia and has received ferrous gluconate 125 mg IV on 11/10. Will continue to follow. Patient and daughter present explained above and are agreeable to this plan.  Electronic Signatures: Izola PricePandit, Daira Hine Raj (MD)  (Signed on 16-Nov-13 00:56)  Authored  Last Updated: [id number]: 16-Nov-13 00:56 by Izola PricePandit, Isaih Bulger Raj (MD)

## 2014-05-05 NOTE — Op Note (Signed)
PATIENT NAME:  Marissa Patton, Marissa Patton MR#:  [id number] DATE OF BIRTH:  1935/12/12  DATE OF PROCEDURE:  11/29/2011  PREOPERATIVE DIAGNOSES:   1. Small bowel obstruction status post lysis of adhesions. 2. Poor venous access with need for IV fluids and nutrition.   POSTOPERATIVE DIAGNOSES:  1. Small bowel obstruction status post lysis of adhesions. 2. Poor venous access with need for IV fluids and nutrition.   PROCEDURES:  1. Ultrasound guidance for vascular access to left basilic vein.  2. Fluoroscopic guidance for placement of catheter.  3. Insertion of peripherally inserted central venous catheter, double lumen, left arm.  SURGEON: Annice NeedyJason S. Dew, MD   ANESTHESIA: Local.   ESTIMATED BLOOD LOSS: Minimal.   INDICATION FOR PROCEDURE: This is a 79 year old female who had bowel surgery last week. She needs PICC line for ongoing IV fluid nutrition as she has been slow to recover. She had extensive bowel resection and hernia repair. We were asked to place a PICC line.   DESCRIPTION OF PROCEDURE: The patient's left arm was sterilely prepped and draped, and a sterile surgical field was created. The left basilic vein was accessed under direct ultrasound guidance without difficulty with a micropuncture needle and permanent image was recorded. 0.018 wire was then placed into the superior vena cava. Peel-away sheath was placed over the wire. A single lumen peripherally inserted central venous catheter was then placed over the wire and the wire and peel-away sheath were removed. The catheter tip was placed into the superior vena cava and was secured at the skin at 46 cm with a sterile dressing.     The catheter withdrew blood well and flushed easily with heparinized saline. The patient tolerated procedure well.  ____________________________ Annice NeedyJason S. Dew, MD jsd:drc D: 11/29/2011 10:28:36 ET T: 11/29/2011 10:42:52 ET JOB#: [id number]  cc: Annice NeedyJason S. Dew, MD, <Dictator> Annice NeedyJASON S DEW MD ELECTRONICALLY  SIGNED 12/04/2011 9:05

## 2014-05-05 NOTE — Consult Note (Signed)
PATIENT NAME:  Marissa Patton, SHACKLETON MR#:  [id number] DATE OF BIRTH:  04-Mar-1935  DATE OF CONSULTATION:  11/23/2011  REFERRING PHYSICIAN:   Marshia Ly, MD CONSULTING PHYSICIAN:  Felipa Furnace, MD  PRIMARY CARE PHYSICIAN: Dr. Elizabeth Sauer   REASON FOR CONSULTATION:  Management of medical conditions in general.   REASON FOR ADMISSION: Small bowel obstruction.   HISTORY OF PRESENT ILLNESS: Marissa Patton is a very nice 79 year old female who has a history of multiple gastrointestinal surgeries with multiple hernia repairs and removal of two cancers on the colon and rectum in 1980 and 2011. She also has a colostomy. She is being [facility] by the [facility] and Dr. Doylene Canning is her oncologist. This morning she called Dr. Doylene Canning  because at 3:00 a.m. she woke up with severe abdominal pain. The abdominal pain was 7 out of 10 and it was located diffusely in the whole abdomen. It felt like a cramp-like pain, and she developed significant nausea. She denies having any vomiting. There was no radiation of the pain, and the pain started to get a little bit through the night, but was waxing and waning through the day. The patient had her last "bowel movement" at 12:00 p.m. She has an ostomy and the last time she cleaned her bag was midnight. In the morning she had some residual mucus but no stool. The patient states that through the last couple of weeks she had some bleeding through the ostomy, and she was told by Dr. Doylene Canning just to stop taking her Advil that she takes for back pain. The patient denies any shortness of breath, dizziness, or significant chest pain. The patient was evaluated in the ER by Dr. Michela Pitcher after a CT scan showed significant small bowel obstruction.   REVIEW OF SYSTEMS:  12-system review of systems is done. Negative fever, negative fatigue, negative weight loss or weight gain. EYES: Negative blurry vision or double vision. No pain in her eyes. ENT: No tinnitus. No difficulty swallowing.  No postnasal dripping. RESPIRATORY: No cough. No hemoptysis. No history of pneumonia. CARDIOVASCULAR: No chest pain. No edema. No arrhythmia. No palpitations. No syncopal episodes. No edemas. GI: As mentioned above. Positive abdominal pain, nausea, no vomiting. Decreased production of ostomy and melena with blood on the stool positive. No jaundice.  GU: No dysuria, hematuria, or increased frequency. Positive increased white blood cells on urinalysis but the patient asymptomatic. We will send for culture. GYN: Negative for vaginal bleeding. Negative for breast masses.  ENDOCRINE: No polyuria, polydipsia, or polyphagia. She used to have diabetes but has been normal for the past three years, just diet controlled. No cold or heat intolerance. She has hypothyroidism and takes Synthroid. HEMATOLOGIC/LYMPHATIC: Bleeding through the ostomy positive. No swollen glands. The patient does have history of colon cancer and she has been clean for several years. She does have anemia. Her last hemoglobin at the doctor's office was around 8 and she had significant improvement with iron. SKIN: No significant lesions or rashes. MUSCULOSKELETAL: Positive for back pain due to compression fracture and osteoporosis in the L-spine. NEURO: No numbness, no weakness, no ataxia. No cerebrovascular accident. No transient ischemic attack. PSYCH: Negative for depression or anxiety.   PAST MEDICAL HISTORY:  1. Hypertension. The patient states that her blood pressure runs always in the 150s to 160s.  2. Diet-controlled diabetes. The patient states she has not had any problems for the past three years. The diabetes went away.  3. Cerebrovascular accident. The patient has history of  cerebrovascular accident after colon cancer surgery with right hemiparesis which has resolved.  4. Anemia of chronic disease due to colon cancer as well as iron deficiency.  5. History of colon cancer.  6. History of rectal cancer in 1980.  7. History of multiple  hernias.  8. Osteoporosis.  9. History of compression fracture.  10. History of renal artery stenosis.   PAST SURGICAL HISTORY:  1. Positive for multiple hernia surgeries.  2. Status post cholecystectomy.  3. Colectomy in 04/2009 and colon/rectal cancer removal in 1980 status post radiation and chemotherapy.  4. Colostomy.  5. History of right renal artery stent.   ALLERGIES: No known drug allergies.   SOCIAL HISTORY: Denies any smoking currently. She used to smoke and quit in 1982. She drinks very seldom. She lives with her husband. She is retired.   FAMILY HISTORY: Positive for diabetes in multiple members of the family, coronary artery disease in her dad who died from a massive heart attack. Hypertension and colon cancer  also positive in her family history.   CURRENT MEDICATIONS:  1. Vitamin D3 once daily.  2. Toprol 25 mg XL once daily.  3. Synthroid 50 mcg orally once daily.  4. Fish oil 1000 mcg? 3 times a day.  5. Vitamin C once daily.  6. Aspirin 81 mg once daily.  7. Ambien 5 mg once daily.  8. Alendronate 70 mg once a week.   PHYSICAL EXAMINATION:  VITAL SIGNS: Blood pressure 164/68, pulse 84, respiratory rate 20, temperature 97.3. The patient is at 92% on room air.   GENERAL: She is alert, oriented times three.  No acute distress. No respiratory distress. Hemodynamically stable.   HEENT: Pupils are equal and reactive. Extraocular movements are intact. Mucosa moist. No oral lesions.   NECK: Supple. No JVD. No thyromegaly. No adenopathy. No carotid bruits are auscultated. Normal range of motion.   CARDIOVASCULAR: Regular rate and rhythm. Positive systolic ejection murmur 3/6, which is a known murmur that she had since she was born. No thrills. No displacement of PMI.   LUNGS: Clear without any wheezing or crepitus. No use of accessory muscles. No dullness to percussion.   ABDOMEN:  Soft, mildly distended. Tender to palpation diffusely, but no rebound. There is no  production of stool in the ostomy. Bowel sounds are negative at this moment.   GENITAL: Negative for external lesions.   EXTREMITIES: No edema, no cyanosis, no clubbing. Deep tendon reflexes +2, pulses +2.   NEUROLOGIC: Cranial nerves II through XII intact. Strength is five out of five in four extremities.   MUSCULOSKELETAL: Negative for significant joint abnormalities. Not tender to palpation on the back at this moment although the patient states that she has chronic back pain.   LYMPHATIC: Negative for lymphadenopathy in neck or supraclavicular or epitrochlear region.   PSYCHIATRIC: Negative for anxiety or significant depression.   LABORATORY, DIAGNOSTIC, AND RADIOLOGICAL DATA: Blood sugar 132, creatinine 1.14, sodium 135, potassium 4.6, lipase 232, amylase 96, hemoglobin 9.9. White count 7.8, and platelets are 130.  White blood cells in urine 28.   ASSESSMENT AND PLAN: This is a very nice 79 year old female with a small bowel obstruction. She has history of cerebrovascular accident, history of severe hypertension, and diabetes no longer on medications. She is diet controlled. She is admitted for management of her small bowel obstruction.  1. Small bowel obstruction, managed by surgery, Dr. Michela Pitcher.  At this moment expectant management, but she might need decompression as per his notes.  For now we are going to keep her n.p.o.  2. Hypertension. The patient has significant high blood pressure with blood pressures in the 160s, mostly isolated systolic hypertension. She is taking Toprol and amlodipine at home. We are going to give her metoprolol IV as scheduled and holding parameters are given for this medication as well.  3. Hypothyroidism. We are going to convert her Synthroid to IV, which will require only 50% of the dose.  4. Diabetes. At this moment the patient does not have any treatment. Her blood sugars have been well controlled,  for which we are only going to check Accu-Cheks in order to  prevent any hypoglycemia.  5. Thrombocytopenia. The patient has mild decrease in platelets, 130, that could be just consumptive for which we are going to be careful. She is on Lovenox and I agree with treatment with Lovenox due to the fact that the patient in the past had a cerebrovascular accident during an operation. This can be held for surgery, but we are going to monitor her platelets.  6. Anemia. The patient has chronic disease anemia and iron deficiency anemia. We are going to check iron levels. She might be a good candidate for iron IV.  7. Increased white blood cells in urine. The patient is totally asymptomatic. No signs or symptoms of cerebrovascular accident. We are just going to check urine culture to make sure.  8. Other medical problems seem to be stable.   I appreciate the consult by Dr. Michela PitcherEly. Thank you for allow me to participate in the care of this really nice patient.   TIME SPENT: I spent about 45 minutes with this consult.     ____________________________ Felipa Furnaceoberto Sanchez Gutierrez, MD rsg:bjt D: 11/23/2011 21:28:00 ET T: 11/24/2011 10:18:16 ET JOB#: [id number]  cc: Felipa Furnaceoberto Sanchez Gutierrez, MD, <Dictator> Duanne Limerickeanna C. Jones, MD Pearletha FurlOBERTO SANCHEZ GUTIERRE MD ELECTRONICALLY SIGNED 11/29/2011 12:48

## 2014-05-05 NOTE — Op Note (Signed)
PATIENT NAME:  Marissa Patton, [name] MR#:  [id number] DATE OF BIRTH:  1935/07/27  DATE OF PROCEDURE:  11/24/2011  PREOPERATIVE DIAGNOSES:  1. Small bowel obstruction.  2. Recurrent ventral hernia.  3. Internal hernia with ischemic bowel.   POSTOPERATIVE DIAGNOSES:  1. Small bowel obstruction.  2. Recurrent ventral hernia.  3. Internal hernia with ischemic bowel.   PROCEDURES: 1. Exploratory laparotomy.  2. Lysis of adhesions.  3. Small bowel resection.  4. Enteroenterostomy.  5. Ventral hernia repair.   SURGEON: Quentin Orealph L. Ely, M.D.   ASSISTANT: Hulda Marinimothy Oaks, M.D.   ANESTHESIA: General.   DESCRIPTION OF PROCEDURE: With the patient in the supine position after the induction of appropriate general anesthesia, the patient's abdomen was prepped by covering the ostomy and then prepping the abdomen with ChloraPrep. An alcohol was utilized and then sterile drapes applied. Betadine impregnated Steri-Drape was placed. A midline incision was made into virgin abdominal territory and tented down toward the obvious ventral hernia. Abdominal cavity was entered but free space was very difficult to identify. Approximately 90 minutes of dissection was required to expose the hernia sac and what appeared to be an area of the previous anastomosis with an internal hernia and a segment of ischemic bowel which had ruptured. There was a small microperforation noted. It was oversewn with 3-0 silk. The bowel was then tediously exposed from ligament of Treitz to the right lower quadrant ileostomy. No other enterotomies were created. The adhesions were very difficult and the dissection was extended. There was an area in the distal small bowel that had a piece of mesh embedded in the small bowel which looked as though it may cause future obstruction. The area of internal hernia and ischemic bowel was resected by dividing the bowel on both sides of the affected area with the GIA-75 stapling device. The mesentery was  taken down with LigaSure device. Bowel limbs were placed side by side and then a side-to-side anastomosis created using the GIA-75 stapling device. The confluence of staple line was oversewn with 3-0 silk and the mesenteric defect was obliterated with 3-0 Vicryl. The area embedded mesh in the distal bowel was bypassed but simply doing an enteroenterostomy. Enterotomy was made on each loop of bowel just distal to the site of the mesh adherence and a limb of the GIA-75 stapling device inserted into each loop of bowel. Stapler was approximated and fired. The defect was closed with two closures of the TX 60 stapling device. Again, the confluence of staple line was oversewn with 3-0 silk and the mesenteric defect was not significant. The area was then copiously suctioned and irrigated. Nasogastric tube had been previously placed and appropriately positioned. Bowel contents were returned to their anatomic position. There did not appear to be any significant fascia distally and because of the bowel contamination I was afraid to place permanent mesh. The bowel muscle could not be mobilized well because of previous bilateral ostomies and the current right lower quadrant ostomy. The upper abdomen was closed with horizontal mattress sutures of 0 Prolene. Vicryl mesh was brought to the table and sutured in the distal defect using 0 Vicryl in a running fashion. The area was then irrigated and the skin clipped. Sterile dressings were applied. The patient was then returned to the recovery room in stable condition. ____________________________ Quentin Orealph L. Ely III, MD rle:slb D: 11/24/2011 17:34:01 ET T: 11/24/2011 17:49:26 ET JOB#: [id number]  cc: Quentin Orealph L. Ely III, MD, <Dictator> Quentin OreALPH L ELY MD ELECTRONICALLY SIGNED 11/30/2011 22:30

## 2014-05-05 NOTE — Discharge Summary (Signed)
PATIENT NAME:  Marissa Patton, Vara L MR#:  [id number] DATE OF BIRTH:  1935-10-21  DATE OF ADMISSION:  11/23/2011 DATE OF DISCHARGE:  12/07/2011  DISCHARGE DIAGNOSES:  1. Small bowel obstruction.  2. Recurrent ventral hernia.  3. Internal hernia with ischemic bowel status post exploratory laparotomy, lysis of adhesions, small bowel resection, enteroenterostomy and ventral hernia repair with Vicryl mesh.  4. Wound opening requiring wound VAC placement.  5. Hypertension.  6. Hypothyroidism.  7. Diabetes mellitus which is diet controlled.  8. History of stroke.  9. Urinary tract infection  DISCHARGE MEDICATIONS:  1. Fosamax 70 mg p.o. daily.  2. Aspirin 81 p.o. daily. 3. Calcium, vitamin D 1 tab p.o. daily. 4. Vitamin D3 1000 p.o. daily. 5. Synthroid 50 mcg p.o. daily. 6. Toprol XL 25 mg p.o. daily. 7. Norvasc 5 mg p.o. daily. 8. Fish oil 1000 mg t.i.d.  9. Ester C with bioflavonoids 500 mg p.o. daily. 10. Ambien 5 mg p.o. at bedtime p.r.n. insomnia.  11. Percocet 1 to 2 tabs p.o. q.4 hours p.r.n. pain.  12. Ciprofloxacin 500 mg orally twice daily x 5 days  PROCEDURES PERFORMED: Exploratory laparotomy with extensive lysis of adhesions, small bowel resection, enteroenterostomy and ventral hernia repair with Vicryl mesh on 11/24/2011.   HOSPITAL COURSE: Postoperatively Ms. [name] was slowly advanced from n.p.o. to regular diet as she had ostomy outcome return. She also was transitioned from IV to p.o. pain medications. She also was initially resuscitated with IV fluids which were discontinued. She was tolerating good p.o. at the time of discharge. Ms. [name] was tolerating good p.o. with good ostomy output and good oral pain control. She did have a small area in her incision which opened up which showed Vicryl mesh underneath which I placed a wound VAC on with Adaptic underneath to prevent the VAC from adhering directly to the Vicryl mesh.   DISCHARGE INSTRUCTIONS: Ms.  [name] is to undergo PT and OT at her rehab facility. She is to have her wound VAC change every approximately three days. As I mentioned she does need to have Adaptic gauze over her incision as well as at the bottom of the opening on top of the Vicryl mesh to prevent the sponge from sticking directly to the mesh and preventing fistula formation. She is to follow up with Dr. Michela PitcherEly in one week. She is to call or return to the ED or clinic if develops fever greater than 101.5, nausea, vomiting or increased pain, also if VAC output changes in character.   ____________________________ Si Raiderhristopher A. Arman Loy, MD cal:cms D: 12/06/2011 11:38:00 ET T: 12/06/2011 16:53:51 ET JOB#: [id number]  cc: Cristal Deerhristopher A. Evaline Waltman, MD, <Dictator> Jarvis NewcomerHRISTOPHER A Katrenia Alkins MD ELECTRONICALLY SIGNED 12/07/2011 10:26

## 2014-05-05 NOTE — H&P (Signed)
PATIENT NAME:  Marissa Patton, Marissa Patton MR#:  [id number] DATE OF BIRTH:  1936-01-11  DATE OF ADMISSION:  11/23/2011  PRIMARY CARE PHYSICIAN: Dr. Elizabeth Sauereanna Jones ADMITTING PHYSICIAN: Dr. Michela PitcherEly   CHIEF COMPLAINT: Abdominal pain.   BRIEF HISTORY: Marissa Patton is a 79 year old woman with a complicated medical history. She developed sudden onset of pain yesterday evening in the lower mid abdomen. [facility] and they suggested workup in the Emergency Room. Emergency Room evaluation demonstrated plain films and CAT scan consistent with possible bowel obstruction. The surgical service was consulted.   She has a long history of previous abdominal surgery. In the 80s she underwent a partial colectomy for colon cancer with an end colostomy. She had multiple parastomal and ventral hernias which were repaired eventually resulting in removal of the colostomy to the right side. She then recently underwent completion colon [facility] and [facility]. She has not had any symptoms since that time. She has had multiple hernia repairs performed by various surgeons in our area and [facility]. She has had no other abdominal surgery. She has no history of hepatitis, yellow jaundice, pancreatitis, peptic ulcer disease or diverticulitis. She has been having recent problems with severe anemia. She had an bleeding from her ileostomy on a regular basis, has been treated with iron injections. Work-up to date has not demonstrated any evidence of source for her bleeding problem. She has a port in place for which she has access to her vascular system for her current therapies.   She has history of hypertension, hypothyroidism, diabetes diet controlled, previous stroke. She has no history of cardiac disease with either congestive heart failure, coronary artery disease or rhythm disturbance.   CURRENT  MEDICATIONS:  1. Fosamax 70 mg p.o. q. week.  2. Aspirin 81 mg p.o. daily.  3. Calcium p.o. daily.  4. Vitamin D3 1000 mg a day.  5. Synthroid 0.5 mcg daily.  6. Toprol-XL 25 mg p.o. daily.  7. Amlodipine 5 mg p.o. daily.  8. Fish oil t.i.d.  9. Ambien 5 mg p.o. daily.   ALLERGIES: She has no medical allergies.   SOCIAL HISTORY: She is retired and lives alone.   PHYSICAL EXAMINATION:  GENERAL: She is an alert, pleasant woman in no significant distress. She says the pain is much better at the present time.   VITAL SIGNS: Blood pressure 170/63, heart rate 82 and regular, respirations 16, temperature 97.9.   HEENT: Examination is unremarkable. She has no scleral icterus. No pupillary abnormalities and no facial deformities.   NECK: Supple without adenopathy or tenderness. Trachea is midline. She has no adenopathy noted.   CHEST: Clear with no adventitious sounds. Normal pulmonary excursion.   CARDIAC: Cardiac exam reveals a 3/6 systolic murmur heard best along the right sternal border. There is no dysrhythmia noted.   ABDOMEN: Her abdomen is mildly distended but generally soft. There is a midline ventral hernia with skin over the bowel which is quite tender. The ostomy looks good with no evidence of any obvious parastomal hernia. She has no rebound. Mild guarding. Active bowel sounds with no rushes or tinkling. She does not have any masses noted.   EXTREMITIES: Lower extremity exam reveals some mild edema. No lower extremity deformity.   PSYCHIATRIC: Psychiatric exam reveals some anxiety but otherwise normal affect and normal orientation.   ASSESSMENT AND PLAN: I have independently reviewed her CT  scan. It is a very strange CT scan I would like to review with the radiologist but does appear to be massively dilated loop of small bowel in the hernia sac. There is only skin over with no abdominal wall covering that area. There does not appear to be any hernia in the ostomy that is  responsible for the current symptoms but the stomach is not dilated. Reading of the CT scan suggests possible bowel obstruction, possible volvulus. With her current bleeding problem I wonder if she does not have an area of ischemic bowel in the site of her current hernia. Will admit her to the hospital, place her on IV rehydration. I will discuss decompression with one of my associates as I don't see an indication at the present time for decompression with her stomach normal size and she does not have any nausea or vomiting. Surgery would be a very difficult endeavor in this situation but may be our only alternative. This plan has been discussed with the patient in detail and she is in agreement.    ____________________________ Carmie End, MD rle:cms D: 11/23/2011 18:30:24 ET T: 11/24/2011 06:20:07 ET JOB#: [id number]  cc: Quentin Ore III, MD, <Dictator> Gerome Sam. Doylene Canning, MD Duanne Limerick, MD Quentin Ore MD ELECTRONICALLY SIGNED 11/30/2011 22:30

## 2014-10-23 IMAGING — CR DG ABDOMEN 2V
1 series · 3 of 3 positions shown · non-contrast
Comparison: none

REASON FOR EXAM: SBO
COMMENTS:

[Series 5: w abdomen upright · 0.14mm/px · 3 of 3 slices shown]
[im 1/3]
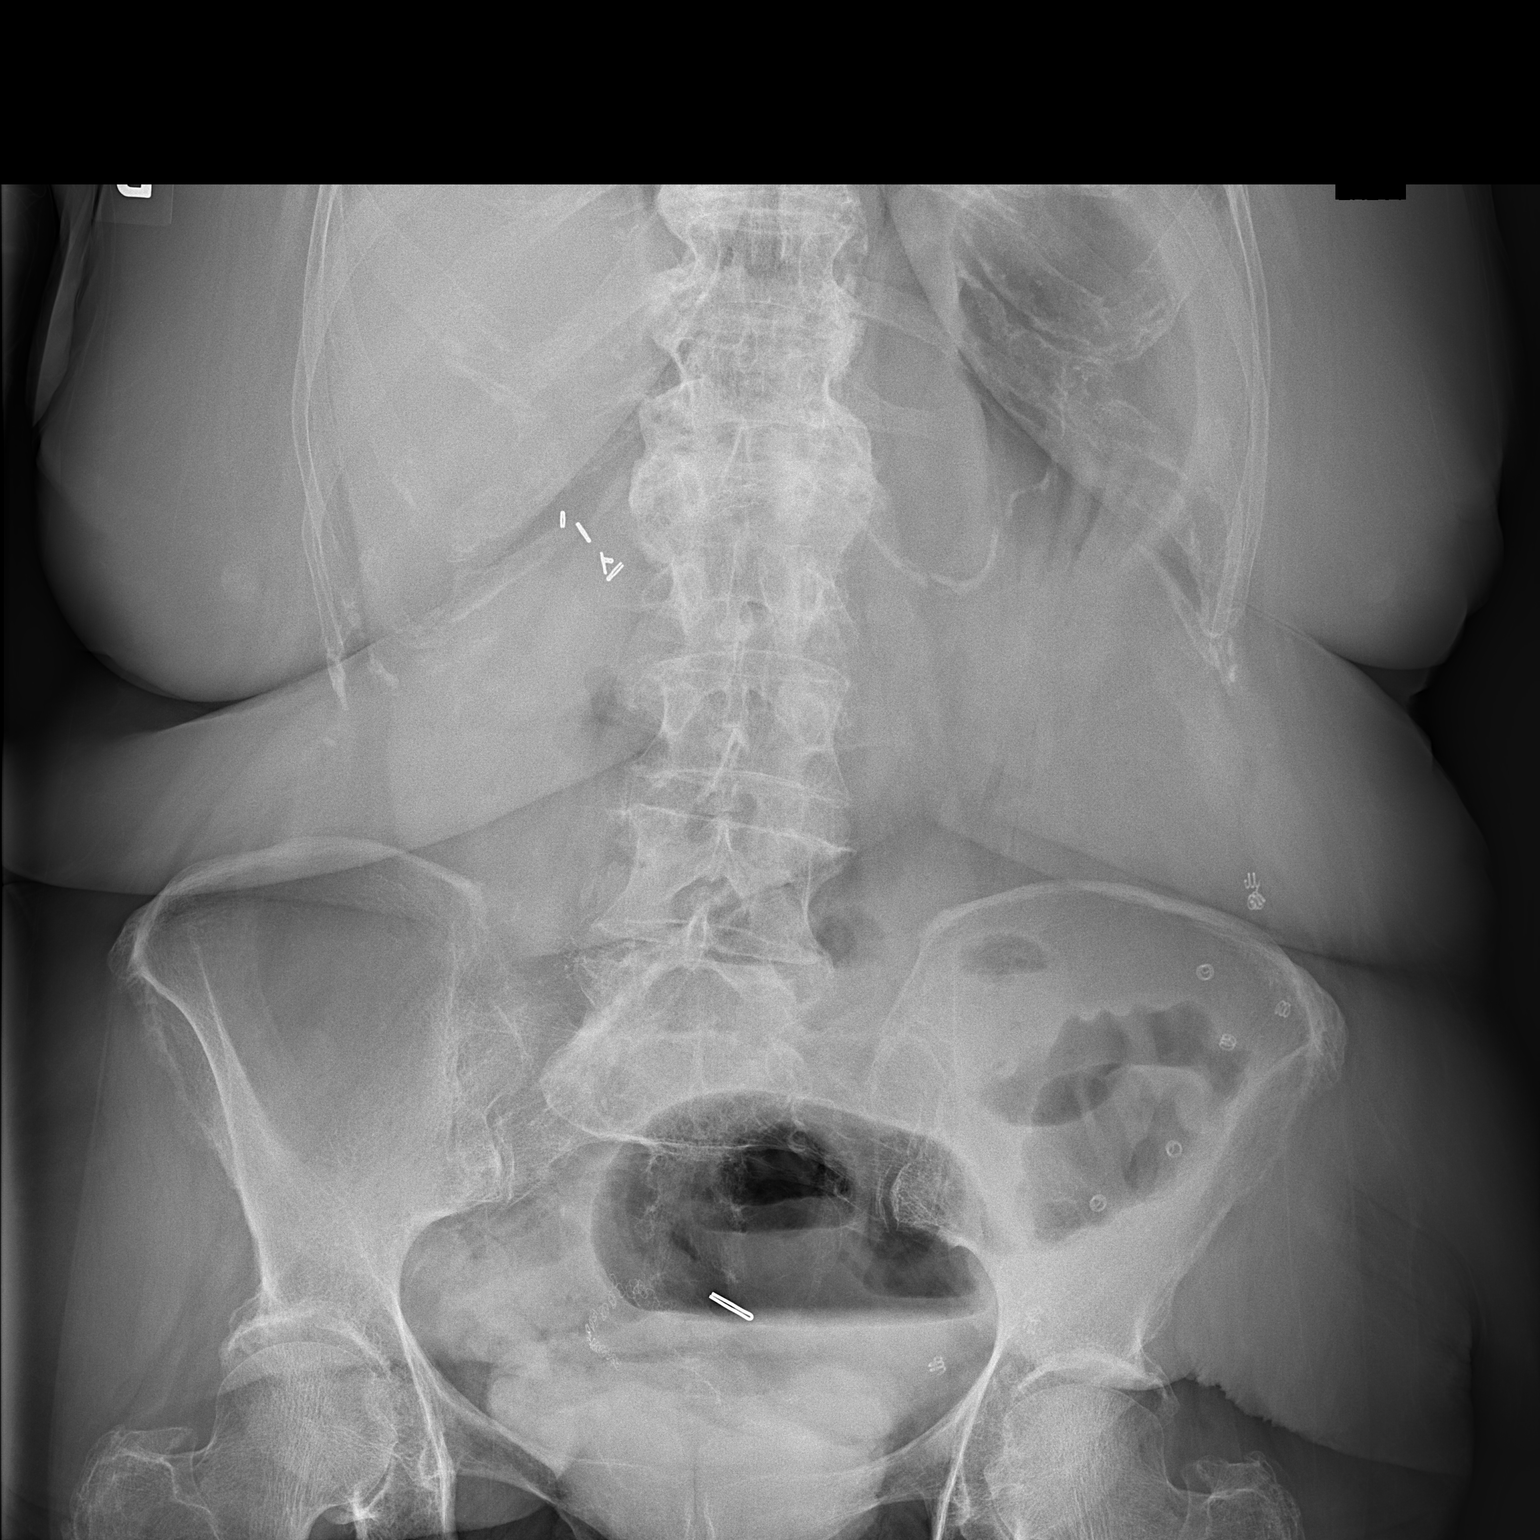
[im 2/3]
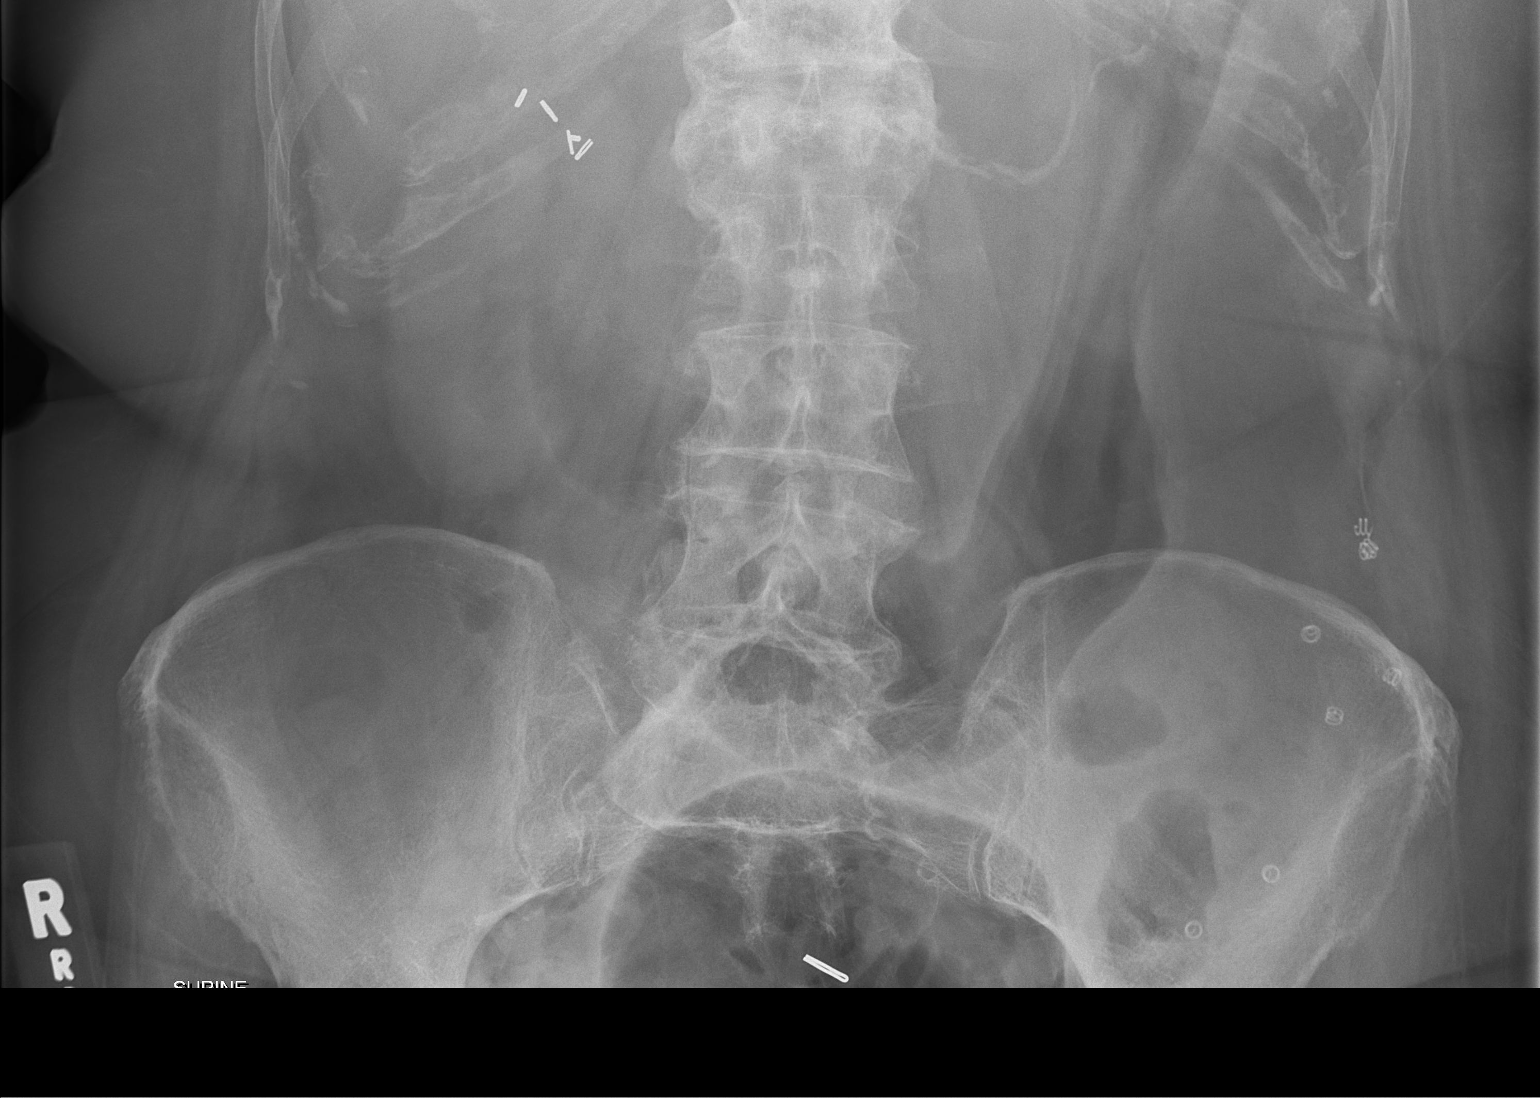
[im 3/3]
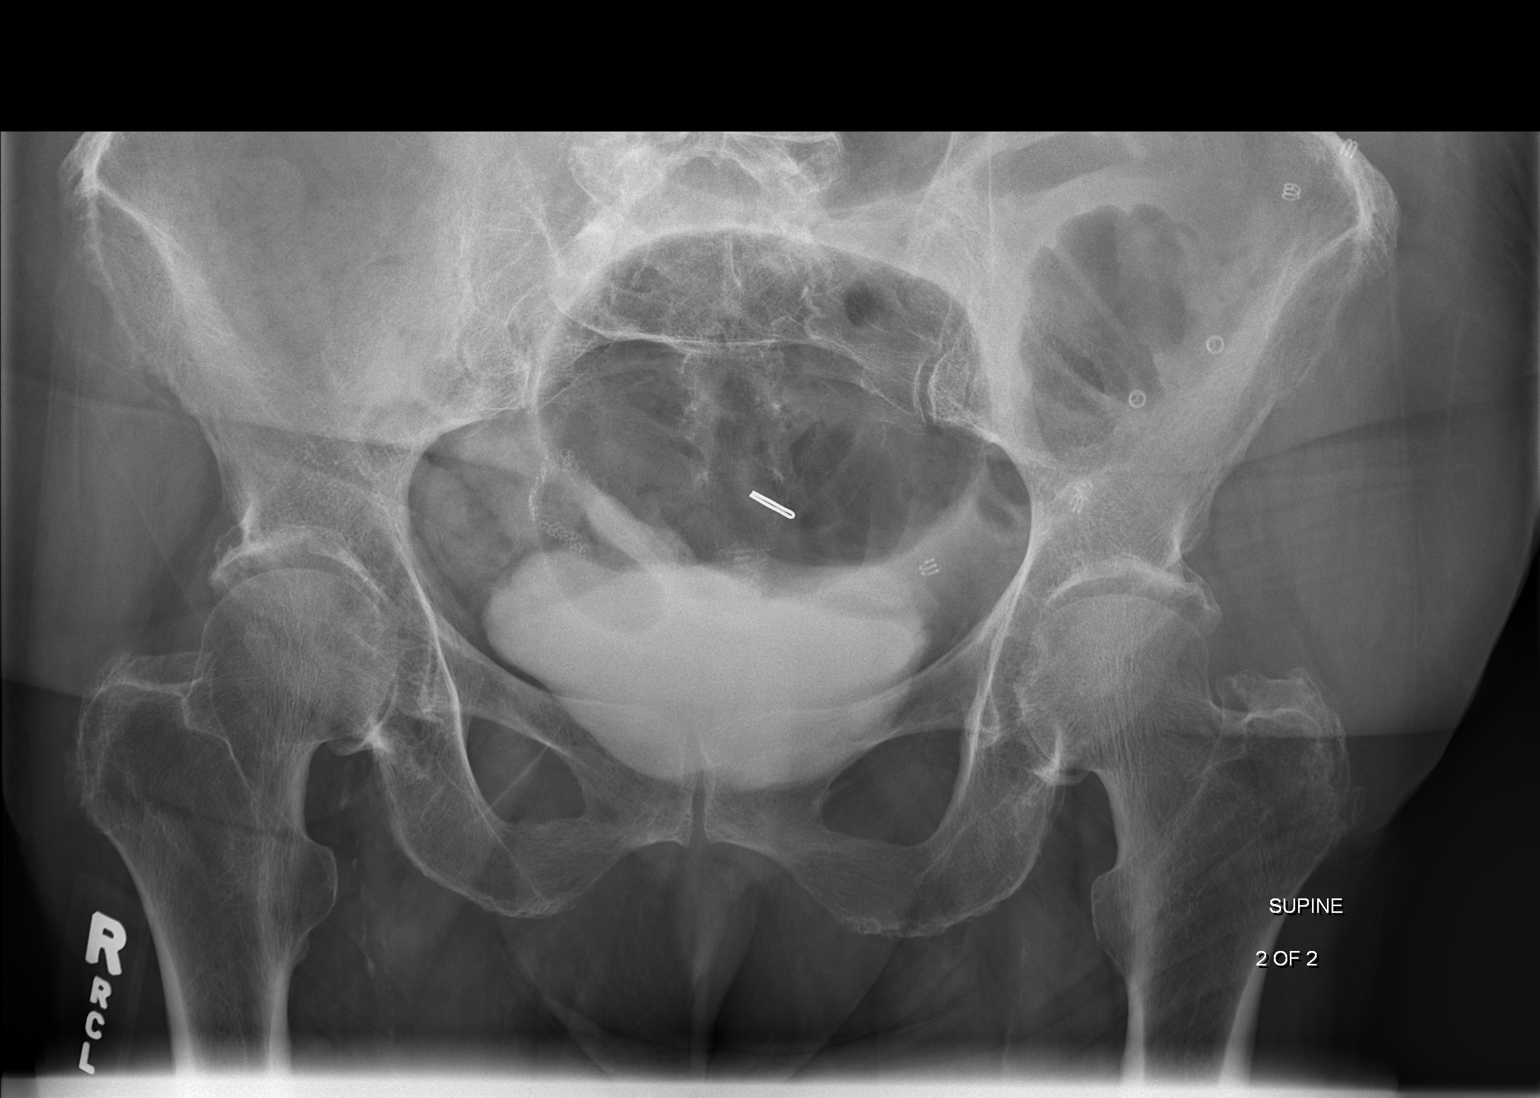

[3 of 3 positions shown; findings below may reference images not displayed]

PROCEDURE:     DXR - DXR ABDOMEN 2 V FLAT AND ERECT  - November 24, 2011  [DATE]

RESULT:     Supine and upright abdominal films are submitted. There is
contrast within the urinary bladder. There is a gas and fluid collection
just above the urinary bladder within the pelvis which corresponds to the
fluid and gas collection seen on the CT scan within the anterior abdominal
wall hernia. The stomach is partially gas-filled and is elongated. There are
surgical clips in the gallbladder fossa as well as deep within the pelvis.
IMPRESSION: There is a persistent gas collection in the midline in the
pelvis. When correlated with the CT scanning images from yesterday's study
this likely reflects the loop of distended small bowel associated with the
anterior abdominal/pelvic wall hernia. No free extraluminal gas collections
are demonstrated on today's study.

[REDACTED]
